# Patient Record
Sex: Male | Born: 1955 | Race: Black or African American | Hispanic: No | Marital: Single | Smoking: Former smoker
Health system: Southern US, Community
[De-identification: ages and names within clinical notes are randomized; demographics above are authoritative.]

## PROBLEM LIST (undated history)

## (undated) DIAGNOSIS — F439 Reaction to severe stress, unspecified: Secondary | ICD-10-CM

## (undated) DIAGNOSIS — N390 Urinary tract infection, site not specified: Secondary | ICD-10-CM

## (undated) DIAGNOSIS — M109 Gout, unspecified: Secondary | ICD-10-CM

---

## 2005-03-14 ENCOUNTER — Emergency Department (HOSPITAL_COMMUNITY): Admission: EM | Admit: 2005-03-14 | Discharge: 2005-03-14 | Payer: Self-pay | Admitting: *Deleted

## 2019-09-21 ENCOUNTER — Other Ambulatory Visit: Payer: Self-pay | Admitting: *Deleted

## 2019-09-21 DIAGNOSIS — Z20822 Contact with and (suspected) exposure to covid-19: Secondary | ICD-10-CM

## 2019-09-23 LAB — NOVEL CORONAVIRUS, NAA: SARS-CoV-2, NAA: NOT DETECTED

## 2019-09-24 ENCOUNTER — Telehealth: Payer: Self-pay | Admitting: General Practice

## 2019-09-24 NOTE — Telephone Encounter (Signed)
Gave patient negative covid test results Patient understood 

## 2019-10-10 ENCOUNTER — Ambulatory Visit
Admission: EM | Admit: 2019-10-10 | Discharge: 2019-10-10 | Disposition: A | Discharge status: Home / Self Care | Payer: No Typology Code available for payment source | Attending: Emergency Medicine | Admitting: Emergency Medicine

## 2019-10-10 ENCOUNTER — Other Ambulatory Visit: Payer: Self-pay

## 2019-10-10 DIAGNOSIS — N3001 Acute cystitis with hematuria: Secondary | ICD-10-CM | POA: Diagnosis present

## 2019-10-10 HISTORY — DX: Gout, unspecified: M10.9

## 2019-10-10 LAB — POCT URINALYSIS DIP (MANUAL ENTRY)
Bilirubin, UA: NEGATIVE
Glucose, UA: 100 mg/dL — AB
Ketones, POC UA: NEGATIVE mg/dL
Nitrite, UA: POSITIVE — AB
Protein Ur, POC: >=300 mg/dL — AB
Spec Grav, UA: 1.01 (ref 1.010–1.025)
Urobilinogen, UA: 2 E.U./dL — AB
pH, UA: 5 (ref 5.0–8.0)

## 2019-10-10 MED ORDER — SULFAMETHOXAZOLE-TRIMETHOPRIM 800-160 MG PO TABS: 1.0000 | ORAL_TABLET | Freq: Two times a day (BID) | ORAL | 0 refills | Status: AC

## 2019-10-10 MED ORDER — PHENAZOPYRIDINE HCL 100 MG PO TABS: 100.0000 mg | ORAL_TABLET | Freq: Three times a day (TID) | ORAL | 0 refills | Status: AC | PRN

## 2019-10-10 NOTE — ED Provider Notes (Addendum)
[facility]    CSN: [id number] Arrival date & time: 10/10/19  1042      History   Chief Complaint Chief Complaint  Patient presents with  . Dysuria    HPI Nicholas Sims is a 63 y.o. male.   Patient also complains of dysuria, urgency and difficulty urinating that began 3 days ago.  He denies a precipitating event or recent sexual encounter. He has tried OTC Azo without relief.  His symptoms are made worse with urination.  He reports similar symptoms in the past that improved with treatment. He complains of decreased amount, increased urgency.  He denies fever, chills, nausea, vomiting, abdominal pain, flank pain, or incontinence  The history is provided by the patient. No language interpreter was used.  Dysuria Presenting symptoms: dysuria     Past Medical History:  Diagnosis Date  . Gout     There are no problems to display for this patient.   History reviewed. No pertinent surgical history.     Home Medications    Prior to Admission medications   Medication Sig Start Date End Date Taking? Authorizing Provider  budesonide-formoterol (SYMBICORT) 160-4.5 MCG/ACT inhaler Inhale 2 puffs into the lungs 2 (two) times daily.   Yes [provider]  furosemide (LASIX) 20 MG tablet Take 20 mg by mouth as needed.   Yes [provider]  phenazopyridine (PYRIDIUM) 100 MG tablet Take 1 tablet (100 mg total) by mouth 3 (three) times daily as needed for pain. 10/10/19   Micca Matura, Zachery DakinsKomlanvi S, FNP  sulfamethoxazole-trimethoprim (BACTRIM DS) 800-160 MG tablet Take 1 tablet by mouth 2 (two) times daily for 7 days. 10/10/19 10/17/19  Durward ParcelAvegno, Malissia Rabbani S, FNP    Family History Family History  Problem Relation Age of Onset  . Diabetes Mother   . COPD Father     Social History Social History   Tobacco Use  . Smoking status: Never Smoker  . Smokeless tobacco: Never Used  Substance Use Topics  . Alcohol use: Not Currently  . Drug use: Not  Currently     Allergies   Penicillins   Review of Systems Review of Systems  Constitutional: Negative.   Respiratory: Negative.   Cardiovascular: Negative.   Gastrointestinal: Negative.   Genitourinary: Positive for dysuria and urgency.  ROS: All other are negative  Physical Exam Triage Vital Signs ED Triage Vitals  Enc Vitals Group     BP 10/10/19 1056 127/77     Pulse Rate 10/10/19 1056 63     Resp 10/10/19 1056 18     Temp 10/10/19 1056 98.1 F (36.7 C)     Temp src --      SpO2 10/10/19 1056 94 %     Weight --      Height --      Head Circumference --      Peak Flow --      Pain Score 10/10/19 1052 7     Pain Loc --      Pain Edu? --      Excl. in GC? --    No data found.  Updated Vital Signs BP 127/77   Pulse 63   Temp 98.1 F (36.7 C)   Resp 18   SpO2 94%   Visual Acuity Right Eye Distance:   Left Eye Distance:   Bilateral Distance:    Right Eye Near:   Left Eye Near:    Bilateral Near:     Physical Exam Constitutional:  General: He is not in acute distress.    Appearance: Normal appearance. He is not ill-appearing or toxic-appearing.  Cardiovascular:     Rate and Rhythm: Normal rate and regular rhythm.     Pulses: Normal pulses.     Heart sounds: Normal heart sounds. No murmur.  Pulmonary:     Effort: Pulmonary effort is normal. No respiratory distress.     Breath sounds: Normal breath sounds.  Chest:     Chest wall: No tenderness.  Abdominal:     General: Abdomen is flat. There is no distension.     Palpations: Abdomen is soft. There is no mass.     Tenderness: There is no abdominal tenderness. There is no guarding or rebound.     Hernia: No hernia is present.  Genitourinary:    Penis: Normal.      Testes: Normal.  Neurological:     Mental Status: He is alert.      UC Treatments / Results  Labs (all labs ordered are listed, but only abnormal results are displayed) Labs Reviewed  POCT URINALYSIS DIP (MANUAL ENTRY) -  Abnormal; Notable for the following components:      Result Value   Color, UA orange (*)    Clarity, UA cloudy (*)    Glucose, UA =100 (*)    Blood, UA large (*)    Protein Ur, POC >=300 (*)    Urobilinogen, UA 2.0 (*)    Nitrite, UA Positive (*)    Leukocytes, UA Large (3+) (*)    All other components within normal limits  URINE CULTURE    EKG   Radiology No results found.  Procedures Procedures (including critical care time)  Medications Ordered in UC Medications - No data to display  Initial Impression / Assessment and Plan / UC Course  I have reviewed the triage vital signs and the nursing notes.  Pertinent labs & imaging results that were available during my care of the patient were reviewed by me and considered in my medical decision making (see chart for details).   Patient stable for discharge.  Benign physical exam.  UA showed large blood red blood cell positive nitrate and white blood cell.  Bactrim DS was prescribed.  Urine was sent for culture.  Will call patient with result.  Advised patient to follow-up with the [place].  Patient verbalized understanding of the plan of care. Final Clinical Impressions(s) / UC Diagnoses   Final diagnoses:  Acute cystitis with hematuria     Discharge Instructions     UA show large red blood cell, positive nitrite and white blood cell Urine culture sent.  We will call you with the results.   Push fluids and get plenty of rest.   Take antibiotic as directed and to completion Take pyridium as prescribed and as needed for symptomatic relief Follow up with PCP if symptoms persists Return here or go to ER if you have any new or worsening symptoms such as fever, worsening abdominal pain, nausea/vomiting, flank pain     ED Prescriptions    Medication Sig Dispense Auth. Provider   phenazopyridine (PYRIDIUM) 100 MG tablet Take 1 tablet (100 mg total) by mouth 3 (three) times daily as needed for pain. 10 tablet Markan Cazarez, Darrelyn Hillock, FNP    sulfamethoxazole-trimethoprim (BACTRIM DS) 800-160 MG tablet Take 1 tablet by mouth 2 (two) times daily for 7 days. 14 tablet Cydney Alvarenga, Darrelyn Hillock, FNP     PDMP not reviewed this encounter.   Gabrelle Roca,  Zachery Dakins, FNP 10/10/19 1132    Durward Parcel, FNP 10/10/19 1427

## 2019-10-10 NOTE — ED Triage Notes (Signed)
Pt presents with difficulty urinating, pt  States that  He has the urge but difficulty urinating, has noticed some blood in urine over past 3 days

## 2019-10-10 NOTE — Discharge Instructions (Addendum)
UA show large red blood cell, positive nitrite and white blood cell Urine culture sent.  We will call you with the results.   Push fluids and get plenty of rest.   Take antibiotic as directed and to completion Take pyridium as prescribed and as needed for symptomatic relief Follow up with PCP if symptoms persists Return here or go to ER if you have any new or worsening symptoms such as fever, worsening abdominal pain, nausea/vomiting, flank pain

## 2019-10-11 ENCOUNTER — Ambulatory Visit: Payer: No Typology Code available for payment source | Attending: Internal Medicine

## 2019-10-11 DIAGNOSIS — Z20822 Contact with and (suspected) exposure to covid-19: Secondary | ICD-10-CM

## 2019-10-12 LAB — URINE CULTURE: Culture: >=100000 — AB

## 2019-10-12 LAB — NOVEL CORONAVIRUS, NAA: SARS-CoV-2, NAA: NOT DETECTED

## 2020-01-30 ENCOUNTER — Emergency Department (HOSPITAL_COMMUNITY): Payer: No Typology Code available for payment source

## 2020-01-30 ENCOUNTER — Other Ambulatory Visit: Payer: Self-pay

## 2020-01-30 ENCOUNTER — Encounter (HOSPITAL_COMMUNITY): Payer: Self-pay | Admitting: Emergency Medicine

## 2020-01-30 ENCOUNTER — Emergency Department (HOSPITAL_COMMUNITY)
Admission: EM | Admit: 2020-01-30 | Discharge: 2020-01-30 | Disposition: A | Discharge status: Home / Self Care | Payer: No Typology Code available for payment source | Attending: Emergency Medicine | Admitting: Emergency Medicine

## 2020-01-30 DIAGNOSIS — Z79899 Other long term (current) drug therapy: Secondary | ICD-10-CM | POA: Insufficient documentation

## 2020-01-30 DIAGNOSIS — S99922A Unspecified injury of left foot, initial encounter: Secondary | ICD-10-CM | POA: Diagnosis present

## 2020-01-30 DIAGNOSIS — Y999 Unspecified external cause status: Secondary | ICD-10-CM | POA: Diagnosis not present

## 2020-01-30 DIAGNOSIS — Y9389 Activity, other specified: Secondary | ICD-10-CM | POA: Diagnosis not present

## 2020-01-30 DIAGNOSIS — Z23 Encounter for immunization: Secondary | ICD-10-CM | POA: Insufficient documentation

## 2020-01-30 DIAGNOSIS — W319XXA Contact with unspecified machinery, initial encounter: Secondary | ICD-10-CM | POA: Diagnosis not present

## 2020-01-30 DIAGNOSIS — Z87891 Personal history of nicotine dependence: Secondary | ICD-10-CM | POA: Diagnosis not present

## 2020-01-30 DIAGNOSIS — Y929 Unspecified place or not applicable: Secondary | ICD-10-CM | POA: Insufficient documentation

## 2020-01-30 DIAGNOSIS — S93115A Dislocation of interphalangeal joint of left lesser toe(s), initial encounter: Secondary | ICD-10-CM | POA: Diagnosis not present

## 2020-01-30 DIAGNOSIS — S93119A Dislocation of interphalangeal joint of unspecified toe(s), initial encounter: Secondary | ICD-10-CM

## 2020-01-30 MED ORDER — TETANUS-DIPHTH-ACELL PERTUSSIS 5-2.5-18.5 LF-MCG/0.5 IM SUSP
0.5000 mL | Freq: Once | INTRAMUSCULAR | Status: AC
  Administered 2020-01-30: 0.5 mL via INTRAMUSCULAR
  Filled 2020-01-30: qty 0.5

## 2020-01-30 MED ORDER — OXYCODONE-ACETAMINOPHEN 5-325 MG PO TABS
1.0000 | ORAL_TABLET | Freq: Once | ORAL | Status: AC
  Administered 2020-01-30: 20:00:00 1 via ORAL
  Filled 2020-01-30: qty 1

## 2020-01-30 NOTE — Discharge Instructions (Addendum)
Clean the wounds with mild soap and water.  You may keep the bandage.  You will need to keep the left third toe buddy taped for at least 2 to 3 weeks.  Wear the postop shoe as needed for support when weightbearing.  You may remove it at bedtime.  Follow-up with your orthopedic provider at the [facility] or you may contact Dr. Mort Sawyers office to arrange a follow-up appointment.  Tylenol every 4 hours if needed for pain.

## 2020-01-30 NOTE — ED Triage Notes (Signed)
Rolled a generator onto L foot   Believes left middle toe is broken  Abrasion to L second toe

## 2020-02-01 NOTE — ED Provider Notes (Signed)
[facility] EMERGENCY DEPARTMENT Provider Note   CSN: [id number] Arrival date & time: 01/30/20  1628     History No chief complaint on file.   Nicholas Sims is a 64 y.o. male.  HPI      Nicholas Sims is a 64 y.o. male who presents to the Emergency Department complaining of pain and deformity of the left third toe secondary to a direct blow that occurred shortly before ER arrival.  He states that he accidentally rolled a heavy generator onto his left foot.  He also reports abrasion to his left third and fourth toes.  He complains of throbbing pain to the toe and is concerned his toe may be broken.  He denies numbness of his foot or toes, ankle pain and use of blood thinners.  Last tetanus is unknown.   Past Medical History:  Diagnosis Date  . Gout     There are no problems to display for this patient.   History reviewed. No pertinent surgical history.     Family History  Problem Relation Age of Onset  . Diabetes Mother   . COPD Father     Social History   Tobacco Use  . Smoking status: Former Research scientist (life sciences)  . Smokeless tobacco: Never Used  Substance Use Topics  . Alcohol use: Not Currently  . Drug use: Not Currently    Home Medications Prior to Admission medications   Medication Sig Start Date End Date Taking? Authorizing Provider  budesonide-formoterol (SYMBICORT) 160-4.5 MCG/ACT inhaler Inhale 2 puffs into the lungs 2 (two) times daily.    [provider]  furosemide (LASIX) 20 MG tablet Take 20 mg by mouth as needed.    [provider]  phenazopyridine (PYRIDIUM) 100 MG tablet Take 1 tablet (100 mg total) by mouth 3 (three) times daily as needed for pain. 10/10/19   Emerson Monte, FNP    Allergies    Penicillins  Review of Systems   Review of Systems  Constitutional: Negative for chills and fever.  Musculoskeletal: Positive for arthralgias (Left third toe pain and swelling) and joint swelling.  Skin: Negative for color change  and wound (Abrasion of the left third and fourth toes).  Neurological: Negative for weakness and numbness.  All other systems reviewed and are negative.   Physical Exam Updated Vital Signs BP (!) 174/94 (BP Location: Left Arm)   Pulse 77   Temp 98.4 F (36.9 C) (Oral)   Resp 18   Ht 6\' 2"  (1.88 m)   Wt 117.9 kg   SpO2 96%   BMI 33.38 kg/m   Physical Exam Vitals and nursing note reviewed.  Constitutional:      Appearance: Normal appearance.  HENT:     Head: Atraumatic.  Cardiovascular:     Rate and Rhythm: Normal rate and regular rhythm.     Pulses: Normal pulses.  Pulmonary:     Effort: Pulmonary effort is normal.  Musculoskeletal:        General: Tenderness, deformity and signs of injury present.     Comments: Tenderness with bony deformity of the left third toe.  There is a small abrasion present to the dorsal surface of the third and fourth toes.  No edema.  Bleeding controlled.  No subungual hematomas and the nail of the third and fourth toe appears intact.  Skin:    General: Skin is warm.     Capillary Refill: Capillary refill takes less than 2 seconds.  Neurological:  General: No focal deficit present.     Sensory: No sensory deficit.     Motor: No weakness.     ED Results / Procedures / Treatments   Labs (all labs ordered are listed, but only abnormal results are displayed) Labs Reviewed - No data to display  EKG None  Radiology DG Toe 3rd Left  Result Date: 01/30/2020 CLINICAL DATA:  Post reduction EXAM: LEFT THIRD TOE COMPARISON:  01/30/2020 at 1723 hours FINDINGS: Interval reduction of the 3rd digit at the PIP joint. No fracture is seen. The visualized soft tissues are unremarkable. IMPRESSION: Interval reduction of the 3rd digit at the PIP joint. Electronically Signed   By: Charline Bills M.D.   On: 01/30/2020 21:59   DG Toe 3rd Left  Result Date: 01/30/2020 CLINICAL DATA:  Left third toe injury EXAM: LEFT THIRD TOE COMPARISON:  None.  FINDINGS: Plantar dislocation of the third digit proximal interphalangeal joint. No acute fracture is seen. Normal alignment of the third MTP and DIP joints. Soft tissue swelling. No radiopaque foreign body. IMPRESSION: Dislocation of the third digit PIP joint. No fracture identified. Attention on postreduction imaging. Electronically Signed   By: Duanne Guess D.O.   On: 01/30/2020 17:54    Procedures .Ortho Injury Treatment  Date/Time: 01/30/2020 9:30 AM Performed by: Pauline Aus, PA-C Authorized by: Pauline Aus, PA-C   Consent:    Consent obtained:  Verbal   Consent given by:  Patient   Risks discussed:  Fracture and nerve damageInjury location: toe Location details: left third toe Injury type: dislocation Dislocation type: PIP Pre-procedure neurovascular assessment: neurovascularly intact Pre-procedure distal perfusion: normal Pre-procedure neurological function: normal Pre-procedure range of motion: normal  Anesthesia: Local anesthesia used: no  Patient sedated: NoManipulation performed: yes Reduction successful: yes X-ray confirmed reduction: yes Immobilization: tape Splint type: buddy taped toes. Supplies used: cotton padding Post-procedure neurovascular assessment: post-procedure neurovascularly intact Post-procedure distal perfusion: normal Post-procedure neurological function: normal Post-procedure range of motion: normal Patient tolerance: patient tolerated the procedure well with no immediate complications    (including critical care time)    Medications Ordered in ED Medications  Tdap (BOOSTRIX) injection 0.5 mL (0.5 mLs Intramuscular Given 01/30/20 1931)  oxyCODONE-acetaminophen (PERCOCET/ROXICET) 5-325 MG per tablet 1 tablet (1 tablet Oral Given 01/30/20 1931)    ED Course  I have reviewed the triage vital signs and the nursing notes.  Pertinent labs & imaging results that were available during my care of the patient were reviewed by me and  considered in my medical decision making (see chart for details).    MDM Rules/Calculators/A&P                      Patient with mechanical injury of the left third toe resulting in a dislocation at the PIP joint.  No open fracture.  Successful reduction in wound, confirmed with postreduction imaging.  Digit remains neurovascularly intact.  No nail injury or subungual hematoma.  Abrasion of the toes were cleaned and bandaged.  Toe was buddy taped and postop shoe applied.  Patient agrees to orthopedic follow-up  Final Clinical Impression(s) / ED Diagnoses Final diagnoses:  Dislocation of interphalangeal joint of toe, initial encounter    Rx / DC Orders ED Discharge Orders    None       Pauline Aus, PA-C 02/01/20 1346    Long, Arlyss Repress, MD 02/02/20 1352

## 2020-10-25 ENCOUNTER — Ambulatory Visit (INDEPENDENT_AMBULATORY_CARE_PROVIDER_SITE_OTHER): Payer: No Typology Code available for payment source | Admitting: Allergy & Immunology

## 2020-10-25 ENCOUNTER — Encounter: Payer: Self-pay | Admitting: Allergy & Immunology

## 2020-10-25 ENCOUNTER — Other Ambulatory Visit: Payer: Self-pay

## 2020-10-25 VITALS — BP 138/88 | HR 70 | Temp 98.1°F | Resp 18 | Ht 74.02 in | Wt 259.0 lb

## 2020-10-25 DIAGNOSIS — J3089 Other allergic rhinitis: Secondary | ICD-10-CM | POA: Diagnosis not present

## 2020-10-25 DIAGNOSIS — J302 Other seasonal allergic rhinitis: Secondary | ICD-10-CM | POA: Diagnosis not present

## 2020-10-25 DIAGNOSIS — L853 Xerosis cutis: Secondary | ICD-10-CM | POA: Insufficient documentation

## 2020-10-25 DIAGNOSIS — J449 Chronic obstructive pulmonary disease, unspecified: Secondary | ICD-10-CM | POA: Diagnosis not present

## 2020-10-25 DIAGNOSIS — J4489 Other specified chronic obstructive pulmonary disease: Secondary | ICD-10-CM

## 2020-10-25 HISTORY — DX: Chronic obstructive pulmonary disease, unspecified: J44.9

## 2020-10-25 HISTORY — DX: Other specified chronic obstructive pulmonary disease: J44.89

## 2020-10-25 MED ORDER — FLUTICASONE PROPIONATE 50 MCG/ACT NA SUSP: 2.0000 | Freq: Every day | NASAL | 5 refills | Status: DC

## 2020-10-25 MED ORDER — CETIRIZINE HCL 10 MG PO TABS: 10.0000 mg | ORAL_TABLET | Freq: Every day | ORAL | 5 refills | Status: DC

## 2020-10-25 NOTE — Patient Instructions (Addendum)
1. Asthma-COPD overlap syndrome (HCC) - Lung testing looked to be in the 50% range today and there was minimal improvement with the albuterol inhaler. - I think that you need to take your Wixela EVERY day.  - Daily controller medication(s): Wixela 250/48mcg one puff twice daily EVERY DAY - Prior to physical activity: albuterol 2 puffs 10-15 minutes before physical activity. - Rescue medications: albuterol 4 puffs every 4-6 hours as needed - Asthma control goals:  * Full participation in all desired activities (may need albuterol before activity) * Albuterol use two time or less a week on average (not counting use with activity) * Cough interfering with sleep two time or less a month * Oral steroids no more than once a year * No hospitalizations  2. Chronic rhinitis - Testing today showed: grasses, ragweed, weeds, trees, indoor molds, outdoor molds, dust mites, cat, dog and cockroach - Copy of test results provided.  - Avoidance measures provided. - Start taking: Zyrtec (cetirizine) 10mg  tablet once daily and Flonase (fluticasone) two sprays per nostril daily- You can use an extra dose of the antihistamine, if needed, for breakthrough symptoms.  - Consider nasal saline rinses 1-2 times daily to remove allergens from the nasal cavities as well as help with mucous clearance (this is especially helpful to do before the nasal sprays are given) - Consider allergy shots as a means of long-term control. - Allergy shots "re-train" and "reset" the immune system to ignore environmental allergens and decrease the resulting immune response to those allergens (sneezing, itchy watery eyes, runny nose, nasal congestion, etc).    - Allergy shots improve symptoms in 75-85% of patients.  - We can discuss more at the next appointment if the medications are not working for you.  3. Dry skin - Be sure to moisturize regularly. - The addition of the antihistamine will help with the dry skin as well. - We can send  in a topical steroid if needed at the next visit.  4. Return in about 6 weeks (around 12/06/2020).  Please inform 12/08/2020 of any Emergency Department visits, hospitalizations, or changes in symptoms. Call us before going to the ED for breathing or allergy symptoms since we might be able to fit you in for a sick visit. Feel free to contact us anytime with any questions, problems, or concerns.  It was a pleasure to meet you today!  Websites that have reliable patient information: 1. American Academy of Asthma, Allergy, and Immunology: www.aaaai.org 2. Food Allergy Research and Education (FARE): foodallergy.org 3. Mothers of Asthmatics: http://www.asthmacommunitynetwork.org 4. American College of Allergy, Asthma, and Immunology: www.acaai.org   COVID-19 Vaccine Information can be found at: [place] For questions related to vaccine distribution or appointments, please email [email or web address] or call [phone].     "Like" [phone] on Facebook and Instagram for our latest updates!       Make sure you are registered to vote! If you have moved or changed any of your contact information, you will need to get this updated before voting!  In some cases, you MAY be able to register to vote online: [place]    Reducing Pollen Exposure  The American Academy of Allergy, Asthma and Immunology suggests the following steps to reduce your exposure to pollen during allergy seasons.    1. Do not hang sheets or clothing out to dry; pollen may collect on these items. 2. Do not mow lawns or spend time around freshly cut grass; mowing stirs up pollen. 3. Keep windows closed at night.  Keep car windows closed while driving. 4. Minimize morning activities outdoors, a time when pollen counts are usually at their highest. 5. Stay indoors as much as possible when pollen counts or humidity is high and on  windy days when pollen tends to remain in the air longer. 6. Use air conditioning when possible.  Many air conditioners have filters that trap the pollen spores. 7. Use a HEPA room air filter to remove pollen form the indoor air you breathe.  Control of Mold Allergen   Mold and fungi can grow on a variety of surfaces provided certain temperature and moisture conditions exist.  Outdoor molds grow on plants, decaying vegetation and soil.  The major outdoor mold, Alternaria and Cladosporium, are found in very high numbers during hot and dry conditions.  Generally, a late Summer - Fall peak is seen for common outdoor fungal spores.  Rain will temporarily lower outdoor mold spore count, but counts rise rapidly when the rainy period ends.  The most important indoor molds are Aspergillus and Penicillium.  Dark, humid and poorly ventilated basements are ideal sites for mold growth.  The next most common sites of mold growth are the bathroom and the kitchen.  Outdoor (Seasonal) Mold Control  Positive outdoor molds via skin testing: Bipolaris (Helminthsporium), Drechslera (Curvalaria) and Mucor  1. Use air conditioning and keep windows closed 2. Avoid exposure to decaying vegetation. 3. Avoid leaf raking. 4. Avoid grain handling. 5. Consider wearing a face mask if working in moldy areas.  6.   Indoor (Perennial) Mold Control   Positive indoor molds via skin testing: Aspergillus, Penicillium, Fusarium, Aureobasidium (Pullulara) and Rhizopus  1. Maintain humidity below 50%. 2. Clean washable surfaces with 5% bleach solution. 3. Remove sources e.g. contaminated carpets.     Control of Dust Mite Allergen    Dust mites play a major role in allergic asthma and rhinitis.  They occur in environments with high humidity wherever human skin is found.  Dust mites absorb humidity from the atmosphere (ie, they do not drink) and feed on organic matter (including shed human and animal skin).  Dust mites are  a microscopic type of insect that you cannot see with the naked eye.  High levels of dust mites have been detected from mattresses, pillows, carpets, upholstered furniture, bed covers, clothes, soft toys and any woven material.  The principal allergen of the dust mite is found in its feces.  A gram of dust may contain 1,000 mites and 250,000 fecal particles.  Mite antigen is easily measured in the air during house cleaning activities.  Dust mites do not bite and do not cause harm to humans, other than by triggering allergies/asthma.    Ways to decrease your exposure to dust mites in your home:  1. Encase mattresses, box springs and pillows with a mite-impermeable barrier or cover   2. Wash sheets, blankets and drapes weekly in hot water (130 F) with detergent and dry them in a dryer on the hot setting.  3. Have the room cleaned frequently with a vacuum cleaner and a damp dust-mop.  For carpeting or rugs, vacuuming with a vacuum cleaner equipped with a high-efficiency particulate air (HEPA) filter.  The dust mite allergic individual should not be in a room which is being cleaned and should wait 1 hour after cleaning before going into the room. 4. Do not sleep on upholstered furniture (eg, couches).   5. If possible removing carpeting, upholstered furniture and drapery from the home is ideal.  Horizontal blinds should be eliminated in the rooms where the person spends the most time (bedroom, study, television room).  Washable vinyl, roller-type shades are optimal. 6. Remove all non-washable stuffed toys from the bedroom.  Wash stuffed toys weekly like sheets and blankets above.   7. Reduce indoor humidity to less than 50%.  Inexpensive humidity monitors can be purchased at most hardware stores.  Do not use a humidifier as can make the problem worse and are not recommended.  Control of Dog or Cat Allergen  Avoidance is the best way to manage a dog or cat allergy. If you have a dog or cat and are allergic  to dog or cats, consider removing the dog or cat from the home. If you have a dog or cat but don't want to find it a new home, or if your family wants a pet even though someone in the household is allergic, here are some strategies that may help keep symptoms at bay:  1. Keep the pet out of your bedroom and restrict it to only a few rooms. Be advised that keeping the dog or cat in only one room will not limit the allergens to that room. 2. Don't pet, hug or kiss the dog or cat; if you do, wash your hands with soap and water. 3. High-efficiency particulate air (HEPA) cleaners run continuously in a bedroom or living room can reduce allergen levels over time. 4. Regular use of a high-efficiency vacuum cleaner or a central vacuum can reduce allergen levels. 5. Giving your dog or cat a bath at least once a week can reduce airborne allergen.  Control of Cockroach Allergen  Cockroach allergen has been identified as an important cause of acute attacks of asthma, especially in urban settings.  There are fifty-five species of cockroach that exist in the [place], however only three, the [place], [place] species produce allergen that can affect patients with Asthma.  Allergens can be obtained from fecal particles, egg casings and secretions from cockroaches.    1. Remove food sources. 2. Reduce access to water. 3. Seal access and entry points. 4. Spray runways with 0.5-1% Diazinon or Chlorpyrifos 5. Blow boric acid power under stoves and refrigerator. 6. Place bait stations (hydramethylnon) at feeding sites.

## 2020-10-25 NOTE — Progress Notes (Signed)
NEW PATIENT  Date of Service/Encounter:  10/25/20  Referring provider: Clinic, [facility]   Assessment:   Asthma-COPD overlap syndrome  Seasonal and perennial allergic rhinitis (grasses, ragweed, weeds, trees, indoor molds, outdoor molds, dust mites, cat, dog and cockroach)  Dry skin  Plan/Recommendations:   1. Asthma-COPD overlap syndrome (HCC) - Lung testing looked to be in the 50% range today and there was minimal improvement with the albuterol inhaler. - I think that you need to take your Wixela EVERY day.  - Daily controller medication(s): Wixela 250/63mcg one puff twice daily EVERY DAY - Prior to physical activity: albuterol 2 puffs 10-15 minutes before physical activity. - Rescue medications: albuterol 4 puffs every 4-6 hours as needed - Asthma control goals:  * Full participation in all desired activities (may need albuterol before activity) * Albuterol use two time or less a week on average (not counting use with activity) * Cough interfering with sleep two time or less a month * Oral steroids no more than once a year * No hospitalizations  2. Chronic rhinitis - Testing today showed: grasses, ragweed, weeds, trees, indoor molds, outdoor molds, dust mites, cat, dog and cockroach - Copy of test results provided.  - Avoidance measures provided. - Start taking: Zyrtec (cetirizine) 10mg  tablet once daily and Flonase (fluticasone) two sprays per nostril daily- You can use an extra dose of the antihistamine, if needed, for breakthrough symptoms.  - Consider nasal saline rinses 1-2 times daily to remove allergens from the nasal cavities as well as help with mucous clearance (this is especially helpful to do before the nasal sprays are given) - Consider allergy shots as a means of long-term control. - Allergy shots "re-train" and "reset" the immune system to ignore environmental allergens and decrease the resulting immune response to those allergens (sneezing, itchy  watery eyes, runny nose, nasal congestion, etc).    - Allergy shots improve symptoms in 75-85% of patients.  - We can discuss more at the next appointment if the medications are not working for you.  3. Dry skin - Be sure to moisturize regularly. - The addition of the antihistamine will help with the dry skin as well. - We can send in a topical steroid if needed at the next visit.  4. Return in about 6 weeks (around 12/06/2020).  Subjective:   Nicholas Sims is a 65 y.o. male presenting today for evaluation of  Chief Complaint  Patient presents with  . Sinus Problem  . Allergy Testing    Nicholas Sims has a history of the following: Patient Active Problem List   Diagnosis Date Noted  . Asthma-COPD overlap syndrome (HCC) 10/25/2020  . Seasonal and perennial allergic rhinitis 10/25/2020  . Dry skin 10/25/2020    History obtained from: chart review and patient.  Nicholas Sims was referred by Clinic, [facility].     Nicholas Sims is a 65 y.o. male presenting for an evaluation of environmental allergies.  He was recently diagnosed with possible allergies. He reports sneezing and coughing in the mornings. Symptoms are bad when the trees leaf out. When the weather changes, it seems that the symptoms get worse. He has never been tested before. He does not take anything for these symptoms at all. He would use a nasal spray around 40 years ago. He does not remember the name of it.  He has done a lot of drugs in the distant past. 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 He was  based in [place]. There was a lot of pollution there which bothered him. He does not have asthma that he knows about. He did smoke until 4-5 years ago, around 2 packs per day. He was on Symbicort at one point PRN, but this was changed to Advair. He also has albuterol that he uses very rarely, probably a couple of months. He has not needed the Advair since November 2021. He has lost weight as well. His breathing  is managed by his PCP in [place] at the [place] there.   He denies any steroid use or antibiotics for his breathing. He does not get sinus infections often. He has had two in his lifetime.   Once he was discharged from [employer], he did some odd jobs. He did work in [employer] nearby. He did that for the longest period of time around 5-6 years. 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 He is now semi retired. He does PRN work as needed and he works there for the last six years. He went back to school to get trained for that.   He has a history of elevated BP as well as a chronic aortic dissection, which is followed by vascular surgery. He also has a history of OSA and has a CPAP machine which is not regularly used.   Otherwise, there is no history of other atopic diseases, including food allergies, drug allergies, stinging insect allergies, eczema, urticaria or contact dermatitis. There is no significant infectious history. Vaccinations are up to date.    Past Medical History: Patient Active Problem List   Diagnosis Date Noted  . Asthma-COPD overlap syndrome (HCC) 10/25/2020  . Seasonal and perennial allergic rhinitis 10/25/2020  . Dry skin 10/25/2020    Medication List:  Allergies as of 10/25/2020      Reactions   Penicillins       Medication List       Accurate as of October 25, 2020 12:16 PM. If you have any questions, ask your nurse or doctor.        STOP taking these medications   budesonide-formoterol 160-4.5 MCG/ACT inhaler Commonly known as: SYMBICORT Stopped by: Alfonse Spruce, MD     TAKE these medications   acetaminophen 500 MG tablet Commonly known as: TYLENOL Take 500 mg by mouth every 6 (six) hours as needed.   furosemide 20 MG tablet Commonly known as: LASIX Take 20 mg by mouth as needed.   losartan 50 MG tablet Commonly known as: COZAAR Take 50 mg by mouth daily.    phenazopyridine 100 MG tablet Commonly known as: Pyridium Take 1 tablet (100 mg total) by mouth 3 (three) times daily as needed for pain.   potassium chloride 10 MEQ CR capsule Commonly known as: MICRO-K Take 10 mEq by mouth 2 (two) times daily.   rosuvastatin 10 MG tablet Commonly known as: CRESTOR Take 10 mg by mouth daily.   tamsulosin 0.4 MG Caps capsule Commonly known as: FLOMAX Take 0.4 mg by mouth.   Wixela Inhub 250-50 MCG/DOSE Aepb Generic drug: Fluticasone-Salmeterol Inhale 1 puff into the lungs 2 (two) times daily.       Birth History: non-contributory  Developmental History: non-contributory  Past Surgical History: History reviewed. No pertinent surgical history.   Family History: Family History  Problem Relation Age of Onset  . Diabetes Mother   . COPD Father      Social History: Nicholas Sims lives at  home in a house that is 64 years old.  There is carpeting and hardwood throughout the home.  They have electric heating as well as oil heating with window units for cooling.  There is a dog outside of the home.  There are dust mite covers on the bedding.  There is no tobacco exposure currently, but he was a smoker for several years.  He quit around 5 years ago.  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 He also does some painting on the side house.     Review of Systems  Constitutional: Negative.  Negative for chills, fever, malaise/fatigue and weight loss.  HENT: Positive for congestion. Negative for ear discharge, ear pain, sinus pain and sore throat.   Eyes: Negative for pain, discharge and redness.  Respiratory: Positive for cough and shortness of breath. Negative for sputum production and wheezing.   Cardiovascular: Negative.  Negative for chest pain and palpitations.  Gastrointestinal: Negative for abdominal pain, constipation, diarrhea, heartburn, nausea and vomiting.   Skin: Negative.  Negative for itching and rash.  Neurological: Negative for dizziness and headaches.  Endo/Heme/Allergies: Negative for environmental allergies. Does not bruise/bleed easily.       Objective:   Blood pressure 138/88, pulse 70, temperature 98.1 F (36.7 C), temperature source Temporal, resp. rate 18, height 6' 2.02" (1.88 m), weight 259 lb (117.5 kg), SpO2 97 %. Body mass index is 33.24 kg/m.   Physical Exam:   Physical Exam Constitutional:      Appearance: He is well-developed.     Comments: Friendly and cooperative with the exam.   HENT:     Head: Normocephalic and atraumatic.     Right Ear: Tympanic membrane, ear canal and external ear normal. No drainage, swelling or tenderness. Tympanic membrane is not injected, scarred, erythematous, retracted or bulging.     Left Ear: Tympanic membrane, ear canal and external ear normal. No drainage, swelling or tenderness. Tympanic membrane is not injected, scarred, erythematous, retracted or bulging.     Nose: No nasal deformity, septal deviation, mucosal edema, rhinorrhea or epistaxis.     Right Turbinates: Enlarged and swollen.     Left Turbinates: Enlarged and swollen.     Right Sinus: No maxillary sinus tenderness or frontal sinus tenderness.     Left Sinus: No maxillary sinus tenderness or frontal sinus tenderness.     Mouth/Throat:     Mouth: Oropharynx is clear and moist. Mucous membranes are not pale and not dry.     Pharynx: Uvula midline.  Eyes:     General:        Right eye: No discharge.        Left eye: No discharge.     Extraocular Movements: EOM normal.     Conjunctiva/sclera: Conjunctivae normal.     Right eye: Right conjunctiva is not injected. No chemosis.    Left eye: Left conjunctiva is not injected. No chemosis.    Pupils: Pupils are equal, round, and reactive to light.  Cardiovascular:     Rate and Rhythm: Normal rate and regular rhythm.     Heart sounds: Normal heart sounds.  Pulmonary:      Effort: Pulmonary effort is normal. No tachypnea, accessory muscle usage or respiratory distress.     Breath sounds: Normal breath sounds. No wheezing, rhonchi or rales.     Comments: Moving air well in all lung fields. He does  have slightly decreased air movement at the bases. No tachypnea.  Chest:     Chest wall: No tenderness.  Abdominal:     Tenderness: There is no abdominal tenderness. There is no guarding or rebound.  Lymphadenopathy:     Head:     Right side of head: No submandibular, tonsillar or occipital adenopathy.     Left side of head: No submandibular, tonsillar or occipital adenopathy.     Cervical: No cervical adenopathy.  Skin:    Coloration: Skin is not pale.     Findings: No abrasion, erythema, petechiae or rash. Rash is not papular, urticarial or vesicular.     Comments: No eczematous or urticarial lesions noted.   Neurological:     Mental Status: He is alert.  Psychiatric:        Mood and Affect: Mood and affect normal.        Behavior: Behavior is cooperative.      Diagnostic studies:    Spirometry: results abnormal (FEV1: 1.81/55%, FVC: 2.40/56%, FEV1/FVC: 75%).    Spirometry consistent with possible restrictive disease. Albuterol four puffs via MDI treatment given in clinic with improvement in FEV1 and FVC, but not significant per ATS criteria.  Allergy Studies:     Airborne Adult Perc - 10/25/20 1000    Time Antigen Placed 0930    Allergen Manufacturer Lavella Hammock    Location Back    Number of Test 59    1. Control-Buffer 50% Glycerol Negative    2. Control-Histamine 1 mg/ml 2+    3. Albumin saline Negative    4. [place] Negative    5. [place] Negative    6. Johnson Negative    7. [place] Blue Negative    8. Meadow Fescue Negative    9. Perennial Rye Negative    10. Sweet Vernal Negative    11. Timothy Negative    12. Cocklebur Negative    13. Burweed Marshelder Negative    14. Ragweed, short Negative    15. Ragweed, Giant Negative    16.  Plantain,  English Negative    17. Lamb's Quarters Negative    18. Sheep Sorrell Negative    19. Rough Pigweed Negative    20. Marsh Elder, Rough Negative    21. Mugwort, Common Negative    22. Ash mix Negative    23. Birch mix Negative    24. Beech American Negative    25. Box, Elder Negative    26. Cedar, red Negative    27. Cottonwood, [place] Negative    28. Elm mix Negative    29. Hickory Negative    30. Maple mix Negative    31. Oak, [place] mix Negative    32. Pecan Pollen Negative    33. Pine mix Negative    34. Sycamore Eastern Negative    35. [place], Black Pollen Negative    36. Alternaria alternata Negative    37. Cladosporium Herbarum Negative    38. Aspergillus mix Negative    39. Penicillium mix Negative    40. Bipolaris sorokiniana (Helminthosporium) Negative    41. Drechslera spicifera (Curvularia) Negative    42. Mucor plumbeus Negative    43. Fusarium moniliforme Negative    44. Aureobasidium pullulans (pullulara) Negative    45. Rhizopus oryzae Negative    46. Botrytis cinera Negative    47. Epicoccum nigrum Negative    48. Phoma betae Negative    49. Candida Albicans Negative    50. Trichophyton mentagrophytes Negative  51. Mite, D Farinae  5,000 AU/ml Negative    52. Mite, D Pteronyssinus  5,000 AU/ml Negative    53. Cat Hair 10,000 BAU/ml Negative    54.  Dog Epithelia Negative    55. Mixed Feathers Negative    56. Horse Epithelia Negative    57. Cockroach, German Negative    58. Mouse Negative    59. Tobacco Leaf Negative          Intradermal - 10/25/20 1000    Time Antigen Placed 1000    Allergen Manufacturer Greer    Location Arm    Number of Test 15    Control Negative    [place] 1+    Johnson 1+    7 Grass 1+    Ragweed mix 2+    Weed mix 2+    Tree mix 1+    Mold 1 Negative    Mold 2 3+    Mold 3 1+    Mold 4 3+    Cat 4+    Dog 4+    Cockroach 1+    Mite mix 1+           Allergy testing results were read and  interpreted by myself, documented by clinical staff.         Malachi Bonds, MD [facility]

## 2020-10-26 ENCOUNTER — Telehealth: Payer: Self-pay | Admitting: Allergy & Immunology

## 2020-10-26 NOTE — Telephone Encounter (Signed)
Kim from the [facility] called to confirm pt was seen on 1/5. Requesting appt notes be faxed to Fax [phone]

## 2020-10-26 NOTE — Telephone Encounter (Signed)
Patient was seen on 10-25-20. I am unsure if Dr. Ellouise Newer note is signed yet.

## 2020-12-08 ENCOUNTER — Ambulatory Visit: Payer: No Typology Code available for payment source | Admitting: Allergy & Immunology

## 2021-01-05 IMAGING — DX DG TOE 3RD 2+V*L*
3 series · 3 of 3 positions shown · non-contrast
Comparison: 01/30/2020 at 0735 hours

CLINICAL DATA: Post reduction

EXAM:
LEFT THIRD TOE

[toe ap]
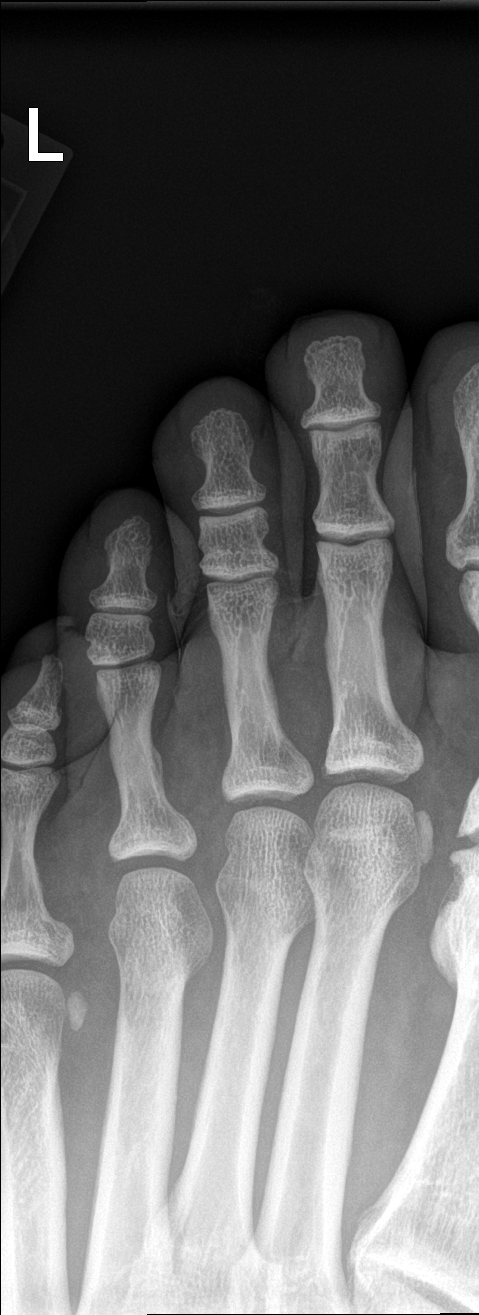

[toe obl]
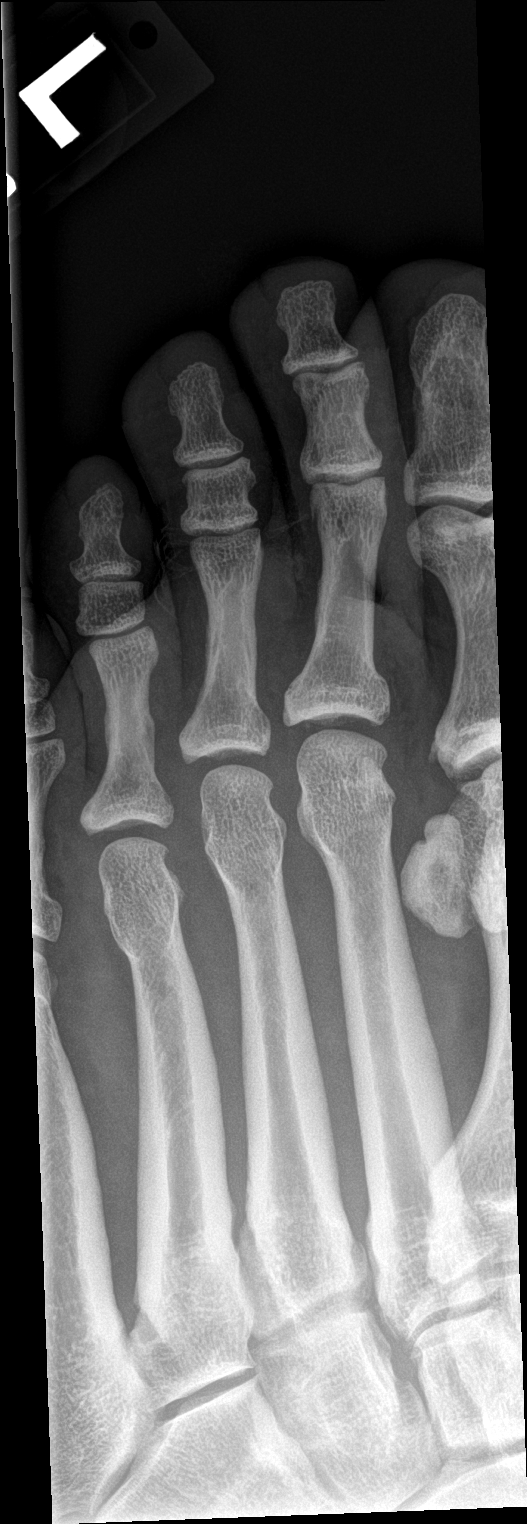

[toe lat]
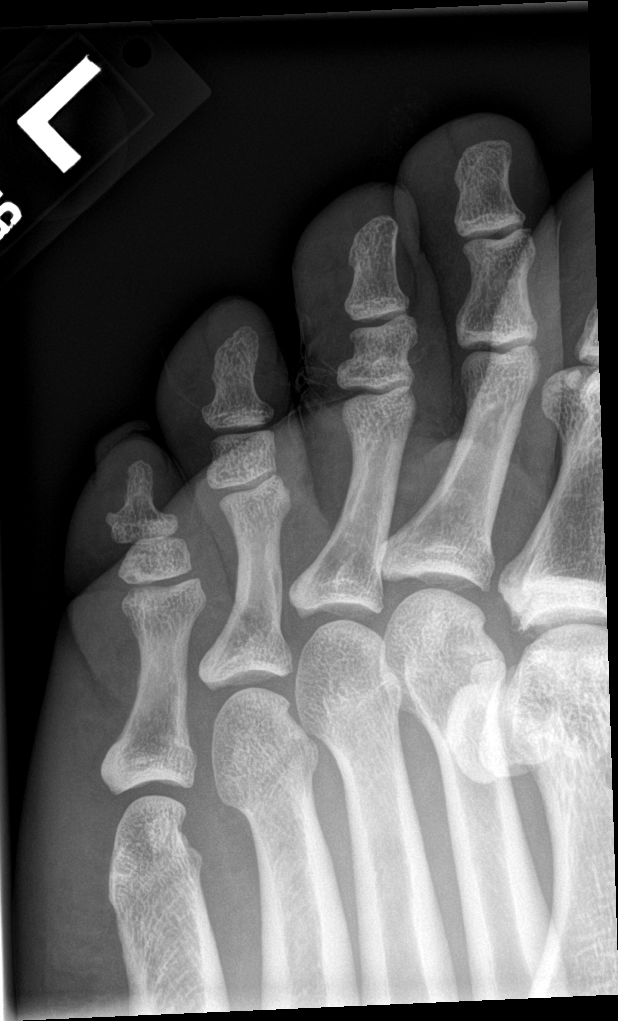

[3 of 3 positions shown; findings below may reference images not displayed]

FINDINGS: Interval reduction of the 3rd digit at the PIP joint.

No fracture is seen.

The visualized soft tissues are unremarkable.
IMPRESSION: Interval reduction of the 3rd digit at the PIP joint.

## 2021-01-17 ENCOUNTER — Ambulatory Visit (INDEPENDENT_AMBULATORY_CARE_PROVIDER_SITE_OTHER): Payer: No Typology Code available for payment source | Admitting: Allergy & Immunology

## 2021-01-17 ENCOUNTER — Encounter: Payer: Self-pay | Admitting: Allergy & Immunology

## 2021-01-17 ENCOUNTER — Other Ambulatory Visit: Payer: Self-pay

## 2021-01-17 VITALS — BP 150/84 | HR 57 | Temp 98.3°F | Resp 12 | Ht 74.0 in | Wt 262.2 lb

## 2021-01-17 DIAGNOSIS — J302 Other seasonal allergic rhinitis: Secondary | ICD-10-CM

## 2021-01-17 DIAGNOSIS — J449 Chronic obstructive pulmonary disease, unspecified: Secondary | ICD-10-CM

## 2021-01-17 DIAGNOSIS — J3089 Other allergic rhinitis: Secondary | ICD-10-CM

## 2021-01-17 DIAGNOSIS — L853 Xerosis cutis: Secondary | ICD-10-CM

## 2021-01-17 DIAGNOSIS — J4489 Other specified chronic obstructive pulmonary disease: Secondary | ICD-10-CM

## 2021-01-17 NOTE — Progress Notes (Signed)
FOLLOW UP  Date of Service/Encounter:  01/17/21   Assessment:   Asthma-COPD overlap syndrome - reviewed medications  Seasonal and perennial allergic rhinitis (grasses, ragweed, weeds, trees, indoor molds, outdoor molds, dust mites, cat, dog and cockroach)  Dry skin  Plan/Recommendations:   1. Asthma-COPD overlap syndrome (HCC) - Lung testing looked stable today. - It is your [facility] that you need EVERY DAY (not your albuterol). - Daily controller medication(s): Wixela 250/19mcg one puff twice daily EVERY DAY - Prior to physical activity: albuterol 2 puffs 10-15 minutes before physical activity. - Rescue medications: albuterol 4 puffs every 4-6 hours as needed - Asthma control goals:  * Full participation in all desired activities (may need albuterol before activity) * Albuterol use two time or less a week on average (not counting use with activity) * Cough interfering with sleep two time or less a month * Oral steroids no more than once a year * No hospitalizations  2. Chronic rhinitis (grasses, ragweed, weeds, trees, indoor molds, outdoor molds, dust mites, cat, dog and cockroach) - We can avoid allergy shots for now.  - Continue taking: Zyrtec (cetirizine) 10mg  tablet once daily and Flonase (fluticasone) two sprays per nostril daily  - Call if you change your mind about the allergy shots.  - Consider getting a bedside humidifier, but the changing season might make this a moot point.   3. Dry skin - Continue to moisturize regularly. - The addition of the antihistamine will help with the dry skin as well.  4. Return in about 6 months (around 07/20/2021).   Subjective:   Nicholas Sims is a 65 y.o. male presenting today for follow up of  Chief Complaint  Patient presents with  . Asthma    COPD    Nicholas Sims has a history of the following: Patient Active Problem List   Diagnosis Date Noted  . Asthma-COPD overlap syndrome (HCC) 10/25/2020  . Seasonal  and perennial allergic rhinitis 10/25/2020  . Dry skin 10/25/2020    History obtained from: chart review and patient.  Nicholas Sims is a 65 y.o. male presenting for a follow up visit. He was last seen in January 2022 as a New Patient. At that time, he had a spirometry in the 50% range.  There was minimal improvement with albuterol.  He was not taking his Advair every day, so we recommended doing that.  For his rhinitis, he had testing that was positive to multiple indoor and outdoor allergens.  We started him on Zyrtec and Flonase.  He does not think he had a routine basis.  Recommend moisturizing for his dry skin.  We also offered a topical steroid.  Since last visit, he reports he has done well.  He remains very busy.  He is a February 2022 and has mostly been focusing on indoor projects.  Because of the cold temperatures, he cannot do any outdoor project since the pain would not drive correctly.  Asthma/Respiratory Symptom History: His breathing seems to be stable.  He does not complain about it at all. He reports that his breathing "comes and goes". He does have episodes occasionally. He does not use his rescue inhaler much. He only uses the albuterol daily and the [facility] as needed. He has them mixed up completely.  He has not required any prednisone and has not been to the emergency room.  Allergic Rhinitis Symptom History: He has not required any antibiotics or prednisone.. These are fairly well controlled. He is using a  space heater in his home. This results in dry air and he is dried out in the morning. He does not have a humidifier that he could add. He is on the fluticasone and the cetirizine. He does report some rhinorrhea with the nose spray. He is doing one spray per nostril daily. He has not needed antibiotics at all.   Otherwise, there have been no changes to his past medical history, surgical history, family history, or social history.    Review of Systems  Constitutional: Negative.   Negative for chills, fever, malaise/fatigue and weight loss.  HENT: Negative.  Negative for congestion, ear discharge, ear pain, sinus pain and sore throat.   Eyes: Negative for pain, discharge and redness.  Respiratory: Positive for cough and shortness of breath. Negative for sputum production and wheezing.   Cardiovascular: Negative.  Negative for chest pain and palpitations.  Gastrointestinal: Negative for abdominal pain, constipation, diarrhea, heartburn, nausea and vomiting.  Skin: Negative.  Negative for itching and rash.  Neurological: Negative for dizziness and headaches.  Endo/Heme/Allergies: Negative for environmental allergies. Does not bruise/bleed easily.       Objective:   Blood pressure (!) 150/84, pulse (!) 57, temperature 98.3 F (36.8 C), temperature source Temporal, resp. rate 12, height 6\' 2"  (1.88 m), weight 262 lb 3.2 oz (118.9 kg), SpO2 98 %. Body mass index is 33.66 kg/m.   Physical Exam:  Physical Exam Constitutional:      Appearance: He is well-developed.     Comments: Pleasant male.  Grateful.  HENT:     Head: Normocephalic and atraumatic.     Right Ear: Tympanic membrane, ear canal and external ear normal.     Left Ear: Tympanic membrane, ear canal and external ear normal.     Nose: No nasal deformity, septal deviation, mucosal edema or rhinorrhea.     Right Turbinates: Enlarged and swollen.     Left Turbinates: Enlarged and swollen.     Right Sinus: No maxillary sinus tenderness or frontal sinus tenderness.     Left Sinus: No maxillary sinus tenderness or frontal sinus tenderness.     Mouth/Throat:     Mouth: Mucous membranes are not pale and not dry.     Pharynx: Uvula midline.  Eyes:     General:        Right eye: No discharge.        Left eye: No discharge.     Conjunctiva/sclera: Conjunctivae normal.     Right eye: Right conjunctiva is not injected. No chemosis.    Left eye: Left conjunctiva is not injected. No chemosis.    Pupils: Pupils  are equal, round, and reactive to light.  Cardiovascular:     Rate and Rhythm: Normal rate and regular rhythm.     Heart sounds: Normal heart sounds.  Pulmonary:     Effort: Pulmonary effort is normal. No tachypnea, accessory muscle usage or respiratory distress.     Breath sounds: Normal breath sounds. No wheezing, rhonchi or rales.     Comments: Scattered wheezes.  Breathing comfortably, however. Chest:     Chest wall: No tenderness.  Lymphadenopathy:     Cervical: No cervical adenopathy.  Skin:    General: Skin is warm.     Capillary Refill: Capillary refill takes less than 2 seconds.     Coloration: Skin is not pale.     Findings: No abrasion, erythema, petechiae or rash. Rash is not papular, urticarial or vesicular.     Comments: No  eczematous or urticarial lesions noted.  Neurological:     Mental Status: He is alert.  Psychiatric:        Behavior: Behavior is cooperative.      Diagnostic studies:    Spirometry: results abnormal (FEV1: 1.49/42%, FVC: 47%, FEV1/FVC: 70%).     Spirometry consistent with possible restrictive disease.  Overall, values are slightly lower than they were previously, but this should be noted that he is not taking his Wixela on a daily basis.   Allergy Studies: none       Nicholas Bonds, MD  [facility]

## 2021-01-17 NOTE — Patient Instructions (Addendum)
1. Asthma-COPD overlap syndrome (HCC) - Lung testing looked stable today. - It is your [facility] that you need EVERY DAY (not your albuterol). - Daily controller medication(s): Wixela 250/51mcg one puff twice daily EVERY DAY - Prior to physical activity: albuterol 2 puffs 10-15 minutes before physical activity. - Rescue medications: albuterol 4 puffs every 4-6 hours as needed - Asthma control goals:  * Full participation in all desired activities (may need albuterol before activity) * Albuterol use two time or less a week on average (not counting use with activity) * Cough interfering with sleep two time or less a month * Oral steroids no more than once a year * No hospitalizations  2. Chronic rhinitis (grasses, ragweed, weeds, trees, indoor molds, outdoor molds, dust mites, cat, dog and cockroach) - We can avoid allergy shots for now.  - Continue taking: Zyrtec (cetirizine) 10mg  tablet once daily and Flonase (fluticasone) two sprays per nostril daily  - Call if you change your mind about the allergy shots.  - Consider getting a bedside humidifier, but the changing season might make this a moot point.   3. Dry skin - Continue to moisturize regularly. - The addition of the antihistamine will help with the dry skin as well.  4. Return in about 6 months (around 07/20/2021).    Please inform 07/22/2021 of any Emergency Department visits, hospitalizations, or changes in symptoms. Call us before going to the ED for breathing or allergy symptoms since we might be able to fit you in for a sick visit. Feel free to contact us anytime with any questions, problems, or concerns.  It was a pleasure to see you again today!  Websites that have reliable patient information: 1. American Academy of Asthma, Allergy, and Immunology: www.aaaai.org 2. Food Allergy Research and Education (FARE): foodallergy.org 3. Mothers of Asthmatics: http://www.asthmacommunitynetwork.org 4. American College of Allergy, Asthma,  and Immunology: www.acaai.org   COVID-19 Vaccine Information can be found at: [place] For questions related to vaccine distribution or appointments, please email [email or web address] or call [phone].   We realize that you might be concerned about having an allergic reaction to the COVID19 vaccines. To help with that concern, WE ARE OFFERING THE COVID19 VACCINES IN OUR OFFICE! Ask the front desk for dates!     "Like" [phone] on Facebook and Instagram for our latest updates!      A healthy democracy works best when [place] participate! Make sure you are registered to vote! If you have moved or changed any of your contact information, you will need to get this updated before voting!  In some cases, you MAY be able to register to vote online: [employer]

## 2021-05-04 ENCOUNTER — Encounter: Payer: Self-pay | Admitting: Allergy & Immunology

## 2021-05-04 ENCOUNTER — Ambulatory Visit (INDEPENDENT_AMBULATORY_CARE_PROVIDER_SITE_OTHER): Payer: No Typology Code available for payment source | Admitting: Family Medicine

## 2021-05-04 ENCOUNTER — Other Ambulatory Visit: Payer: Self-pay

## 2021-05-04 VITALS — BP 140/80 | HR 55 | Temp 98.6°F | Resp 18 | Ht 74.0 in | Wt 256.0 lb

## 2021-05-04 DIAGNOSIS — H1013 Acute atopic conjunctivitis, bilateral: Secondary | ICD-10-CM

## 2021-05-04 DIAGNOSIS — K219 Gastro-esophageal reflux disease without esophagitis: Secondary | ICD-10-CM | POA: Diagnosis not present

## 2021-05-04 DIAGNOSIS — J302 Other seasonal allergic rhinitis: Secondary | ICD-10-CM

## 2021-05-04 DIAGNOSIS — H1010 Acute atopic conjunctivitis, unspecified eye: Secondary | ICD-10-CM

## 2021-05-04 DIAGNOSIS — J3089 Other allergic rhinitis: Secondary | ICD-10-CM

## 2021-05-04 DIAGNOSIS — J449 Chronic obstructive pulmonary disease, unspecified: Secondary | ICD-10-CM

## 2021-05-04 DIAGNOSIS — J4489 Other specified chronic obstructive pulmonary disease: Secondary | ICD-10-CM

## 2021-05-04 MED ORDER — FLUTICASONE PROPIONATE 50 MCG/ACT NA SUSP: 2.0000 | Freq: Every day | NASAL | 5 refills | Status: AC

## 2021-05-04 MED ORDER — CETIRIZINE HCL 10 MG PO TABS: 10.0000 mg | ORAL_TABLET | Freq: Every day | ORAL | 5 refills | Status: AC

## 2021-05-04 MED ORDER — FLUTICASONE-SALMETEROL 250-50 MCG/ACT IN AEPB: 1.0000 | INHALATION_SPRAY | Freq: Two times a day (BID) | RESPIRATORY_TRACT | 5 refills | Status: AC

## 2021-05-04 NOTE — Addendum Note (Signed)
Addended by: Grier Rocher on: 05/04/2021 12:58 PM   Modules accepted: Orders

## 2021-05-04 NOTE — Patient Instructions (Addendum)
Asthma/COPD overlap Increase Wixela 250-1 puff twice a day to prevent cough or wheeze Continue albuterol 2 puffs once every 4 hours as needed for cough or wheeze You may use albuterol 2 puffs 5 to 15 minutes before activity to decrease cough or wheeze  Allergic rhinitis Continue allergen avoidance measures directed toward grass pollen, weed pollen, ragweed pollen, tree pollen, mold, dust mite, cat, dog, and cockroach as listed below Continue cetirizine 10 mg once a day as needed for runny nose or itch Continue Flonase 2 sprays in each nostril once a day as needed for stuffy nose. In the right nostril, point the applicator out toward the right ear. In the left nostril, point the applicator out toward the left ear Consider saline nasal rinses as needed for nasal symptoms. Use this before any medicated nasal sprays for best result Consider allergen immunotherapy if the treatment plan as listed above does not control your symptoms of allergic rhinitis  Allergic conjunctivitis Begin olopatadine 0.1% 1 drop in each eye twice a day as needed for red or itchy eyes  Reflux Continue dietary and lifestyle modifications as listed below  Call the clinic if this treatment plan is not working well for you.  Follow up in 6 months or sooner if needed.  Reducing Pollen Exposure The American Academy of Allergy, Asthma and Immunology suggests the following steps to reduce your exposure to pollen during allergy seasons. Do not hang sheets or clothing out to dry; pollen may collect on these items. Do not mow lawns or spend time around freshly cut grass; mowing stirs up pollen. Keep windows closed at night.  Keep car windows closed while driving. Minimize morning activities outdoors, a time when pollen counts are usually at their highest. Stay indoors as much as possible when pollen counts or humidity is high and on windy days when pollen tends to remain in the air longer. Use air conditioning when possible.   Many air conditioners have filters that trap the pollen spores. Use a HEPA room air filter to remove pollen form the indoor air you breathe.  Control of Dog or Cat Allergen Avoidance is the best way to manage a dog or cat allergy. If you have a dog or cat and are allergic to dog or cats, consider removing the dog or cat from the home. If you have a dog or cat but don't want to find it a new home, or if your family wants a pet even though someone in the household is allergic, here are some strategies that may help keep symptoms at bay:  Keep the pet out of your bedroom and restrict it to only a few rooms. Be advised that keeping the dog or cat in only one room will not limit the allergens to that room. Don't pet, hug or kiss the dog or cat; if you do, wash your hands with soap and water. High-efficiency particulate air (HEPA) cleaners run continuously in a bedroom or living room can reduce allergen levels over time. Regular use of a high-efficiency vacuum cleaner or a central vacuum can reduce allergen levels. Giving your dog or cat a bath at least once a week can reduce airborne allergen.  Control of Mold Allergen Mold and fungi can grow on a variety of surfaces provided certain temperature and moisture conditions exist.  Outdoor molds grow on plants, decaying vegetation and soil.  The major outdoor mold, Alternaria and Cladosporium, are found in very high numbers during hot and dry conditions.  Generally, a late  Summer - Fall peak is seen for common outdoor fungal spores.  Rain will temporarily lower outdoor mold spore count, but counts rise rapidly when the rainy period ends.  The most important indoor molds are Aspergillus and Penicillium.  Dark, humid and poorly ventilated basements are ideal sites for mold growth.  The next most common sites of mold growth are the bathroom and the kitchen.  Outdoor [employer] Use air conditioning and keep windows closed Avoid exposure to decaying  vegetation. Avoid leaf raking. Avoid grain handling. Consider wearing a face mask if working in moldy areas.  Indoor Mold Control Maintain humidity below 50%. Clean washable surfaces with 5% bleach solution. Remove sources e.g. Contaminated carpets.   Control of Dust Mite Allergen Dust mites play a major role in allergic asthma and rhinitis. They occur in environments with high humidity wherever human skin is found. Dust mites absorb humidity from the atmosphere (ie, they do not drink) and feed on organic matter (including shed human and animal skin). Dust mites are a microscopic type of insect that you cannot see with the naked eye. High levels of dust mites have been detected from mattresses, pillows, carpets, upholstered furniture, bed covers, clothes, soft toys and any woven material. The principal allergen of the dust mite is found in its feces. A gram of dust may contain 1,000 mites and 250,000 fecal particles. Mite antigen is easily measured in the air during house cleaning activities. Dust mites do not bite and do not cause harm to humans, other than by triggering allergies/asthma.  Ways to decrease your exposure to dust mites in your home:  1. Encase mattresses, box springs and pillows with a mite-impermeable barrier or cover  2. Wash sheets, blankets and drapes weekly in hot water (130 F) with detergent and dry them in a dryer on the hot setting.  3. Have the room cleaned frequently with a vacuum cleaner and a damp dust-mop. For carpeting or rugs, vacuuming with a vacuum cleaner equipped with a high-efficiency particulate air (HEPA) filter. The dust mite allergic individual should not be in a room which is being cleaned and should wait 1 hour after cleaning before going into the room.  4. Do not sleep on upholstered furniture (eg, couches).  5. If possible removing carpeting, upholstered furniture and drapery from the home is ideal. Horizontal blinds should be eliminated in the  rooms where the person spends the most time (bedroom, study, television room). Washable vinyl, roller-type shades are optimal.  6. Remove all non-washable stuffed toys from the bedroom. Wash stuffed toys weekly like sheets and blankets above.  7. Reduce indoor humidity to less than 50%. Inexpensive humidity monitors can be purchased at most hardware stores. Do not use a humidifier as can make the problem worse and are not recommended.  Control of Cockroach Allergen Cockroach allergen has been identified as an important cause of acute attacks of asthma, especially in urban settings.  There are fifty-five species of cockroach that exist in the [place], however only three, the [place], [place] species produce allergen that can affect patients with Asthma.  Allergens can be obtained from fecal particles, egg casings and secretions from cockroaches.    Remove food sources. Reduce access to water. Seal access and entry points. Spray runways with 0.5-1% Diazinon or Chlorpyrifos Blow boric acid power under stoves and refrigerator. Place bait stations (hydramethylnon) at feeding sites.

## 2021-05-04 NOTE — Progress Notes (Signed)
[address] [place] [address] Dept: [phone]  FOLLOW UP NOTE  Patient ID: Nicholas Sims, male    DOB: 01/22/1956  Age: 65 y.o. MRN: [id number] Date of Office Visit: 05/04/2021  Assessment  Chief Complaint: Asthma (ACT - 17 ) and Allergic Rhinitis  (Wheezing, coughing, congestion, and some shortness of breath )  HPI Nicholas Sims is a 65 year old male who presents to the clinic for follow-up visit.  He was last seen in this clinic on 01/17/2021 by Dr. Dellis Anes for evaluation of asthma and allergic rhinitis.  At today's visit, he reports his asthma has been moderately well controlled with shortness of breath and cough occurring about 4 times a month.  He reports this can be triggered by lying down or with intense activity.  He reports wheezing continuously.  He continues Wixela about 2 days a week and uses albuterol about 4 times a month with relief of symptoms.  He reports some asthma triggers include odors and pollen season.  Reflux is reported as moderately well controlled with infrequent heartburn for which he takes an over-the-counter medication with relief of symptoms.  Allergic rhinitis is reported as moderately well controlled at this time with symptoms including clear rhinorrhea, nasal congestion occurring in the morning and occasional sneezing spells.  He does report an increase in the symptoms between March and May each year.  He continues cetirizine and Flonase as needed.  He reports poor application technique with Flonase.  Allergic conjunctivitis is reported as poorly controlled with red and itchy eyes occurring frequently.  He is not currently using any medical intervention for allergic conjunctivitis.  His current medications are listed in the chart.   Drug Allergies:  Allergies  Allergen Reactions   Penicillins     Physical Exam: BP 140/80   Pulse (!) 55   Temp 98.6 F (37 C)   Resp 18   Ht 6\' 2"  (1.88 m)   Wt 256 lb (116.1 kg)   SpO2 97%    BMI 32.87 kg/m    Physical Exam Vitals reviewed.  Constitutional:      Appearance: Normal appearance.  HENT:     Head: Normocephalic and atraumatic.     Right Ear: Tympanic membrane normal.     Left Ear: Tympanic membrane normal.     Nose:     Comments: Bilateral nares edematous and pale with clear nasal drainage noted.  Pharynx normal.  Ears normal.  Eyes normal.    Mouth/Throat:     Pharynx: Oropharynx is clear.  Eyes:     Conjunctiva/sclera: Conjunctivae normal.  Cardiovascular:     Rate and Rhythm: Normal rate and regular rhythm.     Heart sounds: Normal heart sounds. No murmur heard. Pulmonary:     Effort: Pulmonary effort is normal.     Breath sounds: Normal breath sounds.     Comments: Lungs clear to auscultation Musculoskeletal:        General: Normal range of motion.     Cervical back: Normal range of motion and neck supple.  Skin:    General: Skin is warm and dry.  Neurological:     Mental Status: He is alert and oriented to person, place, and time.  Psychiatric:        Mood and Affect: Mood normal.        Behavior: Behavior normal.        Thought Content: Thought content normal.        Judgment: Judgment normal.  Diagnostics: FVC 2.54, FEV1 1.74.  Predicted FVC 4.04, predicted FEV1 3.11.  Spirometry indicates moderate restriction.  This is improved from his last spirometry reading.  Assessment and Plan: 1. Asthma-COPD overlap syndrome (HCC)   2. Seasonal and perennial allergic rhinitis   3. Seasonal allergic conjunctivitis   4. Gastroesophageal reflux disease, unspecified whether esophagitis present     Meds ordered this encounter  Medications   cetirizine (ZYRTEC) 10 MG tablet    Sig: Take 1 tablet (10 mg total) by mouth daily.    Dispense:  30 tablet    Refill:  5   fluticasone (FLONASE) 50 MCG/ACT nasal spray    Sig: Place 2 sprays into both nostrils daily.    Dispense:  16 g    Refill:  5   fluticasone-salmeterol (WIXELA INHUB) 250-50  MCG/ACT AEPB    Sig: Inhale 1 puff into the lungs in the morning and at bedtime.    Dispense:  60 each    Refill:  5     Patient Instructions  Asthma/COPD overlap Increase Wixela 250-1 puff twice a day to prevent cough or wheeze Continue albuterol 2 puffs once every 4 hours as needed for cough or wheeze You may use albuterol 2 puffs 5 to 15 minutes before activity to decrease cough or wheeze  Allergic rhinitis Continue allergen avoidance measures directed toward grass pollen, weed pollen, ragweed pollen, tree pollen, mold, dust mite, cat, dog, and cockroach as listed below Continue cetirizine 10 mg once a day as needed for runny nose or itch Continue Flonase 2 sprays in each nostril once a day as needed for stuffy nose. In the right nostril, point the applicator out toward the right ear. In the left nostril, point the applicator out toward the left ear Consider saline nasal rinses as needed for nasal symptoms. Use this before any medicated nasal sprays for best result Consider allergen immunotherapy if the treatment plan as listed above does not control your symptoms of allergic rhinitis  Allergic conjunctivitis Begin olopatadine 0.1% 1 drop in each eye twice a day as needed for red or itchy eyes  Reflux Continue dietary and lifestyle modifications as listed below  Call the clinic if this treatment plan is not working well for you.  Follow up in 6 months or sooner if needed.   Return in about 6 months (around 11/04/2021), or if symptoms worsen or fail to improve.    Thank you for the opportunity to care for this patient.  Please do not hesitate to contact me with questions.  Thermon Leyland, FNP [facility]

## 2021-10-11 ENCOUNTER — Telehealth: Payer: Self-pay | Admitting: Allergy & Immunology

## 2021-10-11 NOTE — Telephone Encounter (Signed)
Faxed renewal of authorization request to [facility], [phone]

## 2021-10-31 NOTE — Telephone Encounter (Signed)
Called [facility] to check on renewal of authorization request, spoke to Chelsea. Inetta Fermo let me know authorization request had not been received.   I have refaxed it to [phone] & I have emailed it to Nurse Gold ([email or web address]), Nurse Clarisse Gouge ([email or web address]) and [email or web address].

## 2021-11-07 ENCOUNTER — Ambulatory Visit: Payer: No Typology Code available for payment source | Admitting: Allergy & Immunology

## 2021-11-16 NOTE — Telephone Encounter (Signed)
Received updated authorization ([id number]). Authorization has been updated in system.

## 2022-03-24 ENCOUNTER — Ambulatory Visit
Admission: EM | Admit: 2022-03-24 | Discharge: 2022-03-24 | Disposition: A | Discharge status: Home / Self Care | Payer: No Typology Code available for payment source | Attending: Family Medicine | Admitting: Family Medicine

## 2022-03-24 DIAGNOSIS — N390 Urinary tract infection, site not specified: Secondary | ICD-10-CM | POA: Diagnosis present

## 2022-03-24 LAB — POCT URINALYSIS DIP (MANUAL ENTRY)
Bilirubin, UA: NEGATIVE
Glucose, UA: NEGATIVE mg/dL
Ketones, POC UA: NEGATIVE mg/dL
Nitrite, UA: POSITIVE — AB
Protein Ur, POC: >=300 mg/dL — AB
Spec Grav, UA: 1.02 (ref 1.010–1.025)
Urobilinogen, UA: 1 E.U./dL
pH, UA: 6 (ref 5.0–8.0)

## 2022-03-24 MED ORDER — SULFAMETHOXAZOLE-TRIMETHOPRIM 800-160 MG PO TABS: 1.0000 | ORAL_TABLET | Freq: Two times a day (BID) | ORAL | 0 refills | Status: AC

## 2022-03-24 NOTE — ED Triage Notes (Signed)
Pt states that last Friday he started having chills and then sweats, headaches and body aches  Pt states he tried some OTC meds and nothing worked  [employer] he has a history of UTI's and he thinks that is what is going on

## 2022-03-24 NOTE — ED Provider Notes (Signed)
[facility]    CSN: [id number] Arrival date & time: 03/24/22  0856      History   Chief Complaint Chief Complaint  Patient presents with   Fever    Headache, body aches and fever    HPI Nicholas Sims is a 66 y.o. male.   Presenting today with chills, sweats, fever, headache, body aches, dysuria, urinary frequency worsening since Friday.  Denies bowel changes, abdominal pain, nausea, vomiting, upper respiratory symptoms.  States he has a history of urinary tract infections that have felt similar and thinks this might be what is going on.  Trying Tylenol with no relief.   Past Medical History:  Diagnosis Date   Asthma-COPD overlap syndrome (HCC) 10/25/2020   Gout     Patient Active Problem List   Diagnosis Date Noted   Asthma-COPD overlap syndrome (HCC) 10/25/2020   Seasonal and perennial allergic rhinitis 10/25/2020   Dry skin 10/25/2020    History reviewed. No pertinent surgical history.     Home Medications    Prior to Admission medications   Medication Sig Start Date End Date Taking? Authorizing Provider  sulfamethoxazole-trimethoprim (BACTRIM DS) 800-160 MG tablet Take 1 tablet by mouth 2 (two) times daily for 7 days. 03/24/22 03/31/22 Yes Particia Nearing, PA-C  acetaminophen (TYLENOL) 500 MG tablet Take 500 mg by mouth every 6 (six) hours as needed.    [provider]  albuterol (VENTOLIN HFA) 108 (90 Base) MCG/ACT inhaler Inhale into the lungs. 03/20/21   [provider]  cetirizine (ZYRTEC) 10 MG tablet Take 1 tablet (10 mg total) by mouth daily. 05/04/21   Hetty Blend, FNP  fluticasone (FLONASE) 50 MCG/ACT nasal spray Place 2 sprays into both nostrils daily. 05/04/21   Ambs, Norvel Richards, FNP  fluticasone-salmeterol (WIXELA INHUB) 250-50 MCG/ACT AEPB Inhale 1 puff into the lungs in the morning and at bedtime. 05/04/21   Hetty Blend, FNP  furosemide (LASIX) 20 MG tablet Take 20 mg by mouth as needed.    [provider]   losartan (COZAAR) 50 MG tablet Take 50 mg by mouth daily.    [provider]  phenazopyridine (PYRIDIUM) 100 MG tablet Take 1 tablet (100 mg total) by mouth 3 (three) times daily as needed for pain. Patient not taking: Reported on 05/04/2021 10/10/19   Durward Parcel, FNP  potassium chloride (MICRO-K) 10 MEQ CR capsule Take 10 mEq by mouth 2 (two) times daily.    [provider]  rosuvastatin (CRESTOR) 10 MG tablet Take 10 mg by mouth daily.    [provider]  tamsulosin (FLOMAX) 0.4 MG CAPS capsule Take 0.4 mg by mouth.    [provider]    Family History Family History  Problem Relation Age of Onset   Diabetes Mother    COPD Father     Social History Social History   Tobacco Use   Smoking status: Former   Smokeless tobacco: Never  Building services engineer Use: Never used  Substance Use Topics   Alcohol use: Not Currently   Drug use: Not Currently     Allergies   Penicillins   Review of Systems Review of Systems Per HPI  Physical Exam Triage Vital Signs ED Triage Vitals  Enc Vitals Group     BP 03/24/22 0930 129/81     Pulse Rate 03/24/22 0930 70     Resp 03/24/22 0930 20     Temp 03/24/22 0930 98.7 F (37.1 C)  Temp Source 03/24/22 0930 Oral     SpO2 03/24/22 0930 93 %     Weight --      Height --      Head Circumference --      Peak Flow --      Pain Score 03/24/22 0927 8     Pain Loc --      Pain Edu? --      Excl. in GC? --    No data found.  Updated Vital Signs BP 129/81 (BP Location: Right Arm)   Pulse 70   Temp 98.7 F (37.1 C) (Oral)   Resp 20   SpO2 93%   Visual Acuity Right Eye Distance:   Left Eye Distance:   Bilateral Distance:    Right Eye Near:   Left Eye Near:    Bilateral Near:     Physical Exam Vitals and nursing note reviewed.  Constitutional:      Appearance: Normal appearance.  HENT:     Head: Atraumatic.  Eyes:     Extraocular Movements: Extraocular movements intact.      Conjunctiva/sclera: Conjunctivae normal.  Cardiovascular:     Rate and Rhythm: Normal rate and regular rhythm.  Pulmonary:     Effort: Pulmonary effort is normal.     Breath sounds: Normal breath sounds.  Abdominal:     General: Bowel sounds are normal. There is no distension.     Palpations: Abdomen is soft.     Tenderness: There is no abdominal tenderness. There is no right CVA tenderness, left CVA tenderness or guarding.  Musculoskeletal:        General: Normal range of motion.     Cervical back: Normal range of motion and neck supple.  Skin:    General: Skin is warm and dry.  Neurological:     General: No focal deficit present.     Mental Status: He is oriented to person, place, and time.  Psychiatric:        Mood and Affect: Mood normal.        Thought Content: Thought content normal.        Judgment: Judgment normal.     UC Treatments / Results  Labs (all labs ordered are listed, but only abnormal results are displayed) Labs Reviewed  POCT URINALYSIS DIP (MANUAL ENTRY) - Abnormal; Notable for the following components:      Result Value   Clarity, UA cloudy (*)    Blood, UA large (*)    Protein Ur, POC >=300 (*)    Nitrite, UA Positive (*)    Leukocytes, UA Large (3+) (*)    All other components within normal limits  URINE CULTURE    EKG   Radiology No results found.  Procedures Procedures (including critical care time)  Medications Ordered in UC Medications - No data to display  Initial Impression / Assessment and Plan / UC Course  I have reviewed the triage vital signs and the nursing notes.  Pertinent labs & imaging results that were available during my care of the patient were reviewed by me and considered in my medical decision making (see chart for details).     Urinalysis with evidence of urinary tract infection, vitals and exam overall reassuring.  Urine culture pending, treat with Bactrim while awaiting results.  Push fluids, return for  worsening symptoms.  Final Clinical Impressions(s) / UC Diagnoses   Final diagnoses:  Acute lower UTI   Discharge Instructions   None  ED Prescriptions     Medication Sig Dispense Auth. Provider   sulfamethoxazole-trimethoprim (BACTRIM DS) 800-160 MG tablet Take 1 tablet by mouth 2 (two) times daily for 7 days. 14 tablet Particia Nearing, [place]      PDMP not reviewed this encounter.   Particia Nearing, [place] 03/24/22 1003

## 2022-03-26 LAB — URINE CULTURE: Culture: >=100000 — AB

## 2022-09-04 ENCOUNTER — Other Ambulatory Visit: Payer: Self-pay

## 2022-09-04 ENCOUNTER — Emergency Department (HOSPITAL_COMMUNITY)
Admission: EM | Admit: 2022-09-04 | Discharge: 2022-09-05 | Disposition: A | Discharge status: Home / Self Care | Payer: No Typology Code available for payment source | Attending: Emergency Medicine | Admitting: Emergency Medicine

## 2022-09-04 ENCOUNTER — Encounter (HOSPITAL_COMMUNITY): Payer: Self-pay

## 2022-09-04 ENCOUNTER — Emergency Department (HOSPITAL_COMMUNITY): Payer: No Typology Code available for payment source

## 2022-09-04 DIAGNOSIS — Z7951 Long term (current) use of inhaled steroids: Secondary | ICD-10-CM | POA: Insufficient documentation

## 2022-09-04 DIAGNOSIS — N189 Chronic kidney disease, unspecified: Secondary | ICD-10-CM | POA: Diagnosis not present

## 2022-09-04 DIAGNOSIS — J45909 Unspecified asthma, uncomplicated: Secondary | ICD-10-CM | POA: Insufficient documentation

## 2022-09-04 DIAGNOSIS — J449 Chronic obstructive pulmonary disease, unspecified: Secondary | ICD-10-CM | POA: Insufficient documentation

## 2022-09-04 DIAGNOSIS — N390 Urinary tract infection, site not specified: Secondary | ICD-10-CM

## 2022-09-04 DIAGNOSIS — N3001 Acute cystitis with hematuria: Secondary | ICD-10-CM | POA: Diagnosis not present

## 2022-09-04 DIAGNOSIS — I129 Hypertensive chronic kidney disease with stage 1 through stage 4 chronic kidney disease, or unspecified chronic kidney disease: Secondary | ICD-10-CM | POA: Diagnosis not present

## 2022-09-04 DIAGNOSIS — N452 Orchitis: Secondary | ICD-10-CM | POA: Diagnosis not present

## 2022-09-04 DIAGNOSIS — Z79899 Other long term (current) drug therapy: Secondary | ICD-10-CM | POA: Insufficient documentation

## 2022-09-04 DIAGNOSIS — R519 Headache, unspecified: Secondary | ICD-10-CM | POA: Diagnosis present

## 2022-09-04 HISTORY — DX: Urinary tract infection, site not specified: N39.0

## 2022-09-04 LAB — COMPREHENSIVE METABOLIC PANEL
ALT: 16 U/L (ref 0–44)
AST: 20 U/L (ref 15–41)
Albumin: 3.8 g/dL (ref 3.5–5.0)
Alkaline Phosphatase: 54 U/L (ref 38–126)
Anion gap: 5 (ref 5–15)
BUN: 21 mg/dL (ref 8–23)
CO2: 25 mmol/L (ref 22–32)
Calcium: 9.1 mg/dL (ref 8.9–10.3)
Chloride: 109 mmol/L (ref 98–111)
Creatinine, Ser: 1.63 mg/dL — ABNORMAL HIGH (ref 0.61–1.24)
GFR, Estimated: 46 mL/min — ABNORMAL LOW (ref >60)
Glucose, Bld: 108 mg/dL — ABNORMAL HIGH (ref 70–99)
Potassium: 4.3 mmol/L (ref 3.5–5.1)
Sodium: 139 mmol/L (ref 135–145)
Total Bilirubin: 0.9 mg/dL (ref 0.3–1.2)
Total Protein: 7.6 g/dL (ref 6.5–8.1)

## 2022-09-04 LAB — URINALYSIS, ROUTINE W REFLEX MICROSCOPIC
Bilirubin Urine: NEGATIVE
Glucose, UA: NEGATIVE mg/dL
Ketones, ur: NEGATIVE mg/dL
Nitrite: POSITIVE — AB
Protein, ur: >=300 mg/dL — AB
Specific Gravity, Urine: 1.01 (ref 1.005–1.030)
pH: 5 (ref 5.0–8.0)

## 2022-09-04 LAB — CBC WITH DIFFERENTIAL/PLATELET
Abs Immature Granulocytes: 0.06 10*3/uL (ref 0.00–0.07)
Basophils Absolute: 0 10*3/uL (ref 0.0–0.1)
Basophils Relative: 0 %
Eosinophils Absolute: 0 10*3/uL (ref 0.0–0.5)
Eosinophils Relative: 0 %
HCT: 40.1 % (ref 39.0–52.0)
Hemoglobin: 13.2 g/dL (ref 13.0–17.0)
Immature Granulocytes: 0 %
Lymphocytes Relative: 9 %
Lymphs Abs: 1.3 10*3/uL (ref 0.7–4.0)
MCH: 30.4 pg (ref 26.0–34.0)
MCHC: 32.9 g/dL (ref 30.0–36.0)
MCV: 92.4 fL (ref 80.0–100.0)
Monocytes Absolute: 0.8 10*3/uL (ref 0.1–1.0)
Monocytes Relative: 6 %
Neutro Abs: 13 10*3/uL — ABNORMAL HIGH (ref 1.7–7.7)
Neutrophils Relative %: 85 %
Platelets: 196 10*3/uL (ref 150–400)
RBC: 4.34 MIL/uL (ref 4.22–5.81)
RDW: 13.1 % (ref 11.5–15.5)
WBC: 15.3 10*3/uL — ABNORMAL HIGH (ref 4.0–10.5)
nRBC: 0 % (ref 0.0–0.2)

## 2022-09-04 LAB — LIPASE, BLOOD: Lipase: 26 U/L (ref 11–51)

## 2022-09-04 MED ORDER — IOHEXOL 300 MG/ML  SOLN
100.0000 mL | Freq: Once | INTRAMUSCULAR | Status: AC | PRN
  Administered 2022-09-04: 100 mL via INTRAVENOUS

## 2022-09-04 MED ORDER — SODIUM CHLORIDE 0.9 % IV BOLUS
1000.0000 mL | Freq: Once | INTRAVENOUS | Status: AC
  Administered 2022-09-04: 1000 mL via INTRAVENOUS

## 2022-09-04 MED ORDER — SODIUM CHLORIDE 0.9 % IV SOLN
2.0000 g | Freq: Once | INTRAVENOUS | Status: AC
  Administered 2022-09-05: 2 g via INTRAVENOUS
  Filled 2022-09-04: qty 20

## 2022-09-04 MED ORDER — HYDROMORPHONE HCL 1 MG/ML IJ SOLN
1.0000 mg | Freq: Once | INTRAMUSCULAR | Status: AC
  Administered 2022-09-04: 1 mg via INTRAVENOUS
  Filled 2022-09-04: qty 1

## 2022-09-04 MED ORDER — ONDANSETRON HCL 4 MG/2ML IJ SOLN
4.0000 mg | Freq: Once | INTRAMUSCULAR | Status: AC
  Administered 2022-09-04: 4 mg via INTRAVENOUS
  Filled 2022-09-04: qty 2

## 2022-09-04 NOTE — ED Provider Notes (Signed)
11:41 PM Assumed care from Dr. Wallace Cullens, please see their note for full history, physical and decision making until this point. In brief this is a 66 y.o. year old male who presented to the ED tonight with Headache, Testicle Pain, and Generalized Body Aches     Has orchitis/UTI, pending CT scan abx given. Likely discharge if feeling bettera nd CT reassuring.   Discharge instructions, including strict return precautions for new or worsening symptoms, given. Patient and/or family verbalized understanding and agreement with the plan as described.   Labs, studies and imaging reviewed by myself and considered in medical decision making if ordered. Imaging interpreted by radiology.  Labs Reviewed  CBC WITH DIFFERENTIAL/PLATELET - Abnormal; Notable for the following components:      Result Value   WBC 15.3 (*)    Neutro Abs 13.0 (*)    All other components within normal limits  COMPREHENSIVE METABOLIC PANEL - Abnormal; Notable for the following components:   Glucose, Bld 108 (*)    Creatinine, Ser 1.63 (*)    GFR, Estimated 46 (*)    All other components within normal limits  URINALYSIS, ROUTINE W REFLEX MICROSCOPIC - Abnormal; Notable for the following components:   APPearance HAZY (*)    Hgb urine dipstick LARGE (*)    Protein, ur >=300 (*)    Nitrite POSITIVE (*)    Leukocytes,Ua MODERATE (*)    Bacteria, UA MANY (*)    All other components within normal limits  URINE CULTURE  LIPASE, BLOOD    US SCROTUM W/DOPPLER  Final Result    CT ABDOMEN PELVIS W CONTRAST    (Results Pending)    No follow-ups on file.

## 2022-09-04 NOTE — ED Triage Notes (Signed)
Pt reports pain in right side and swollen left testicle. Pt has cold chills. Hx of UTI. Pt states he just had a kidney biopsy and  it was negative. Pt reports a headache

## 2022-09-04 NOTE — ED Notes (Signed)
Pt taken to [place] with chaperone

## 2022-09-04 NOTE — ED Provider Notes (Signed)
[facility] EMERGENCY DEPARTMENT Provider Note   CSN: [id number] Arrival date & time: 09/04/22  2025     History {Add pertinent medical, surgical, social history, OB history to HPI:1} Chief Complaint  Patient presents with   Headache   Testicle Pain   Generalized Body Aches    Nicholas Sims is a 66 y.o. male.  Patient as above with significant medical history as below, including asthma, copd, recurrent uti, gout, ckd, nephrotic, htn who presents to the ED with complaint of rlq abd pain, right testicle pain. RLQ pain since yesterday afternoon, RLQ sharp stabbing/ radiation to right inguinal area. Began to have right sided testicle pain/swelling over last 12 hours. Reports increased thirst and appetite last 12 hours, feeling hot/cold, no change to bowel function. Had some pain with urination earlier today which has since improved, thinks he may have  had some blood in his urine earlier as well. He had a sharp pain to his urethra earlier when he was urinating but this has since subsided. No concern for STI. No recent testicular trauma. No rash. Not diabetic.      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 No pertinent surgical history.   The history is provided by the patient. No language interpreter was used.  Headache Associated symptoms: abdominal pain, fever and nausea   Associated symptoms: no cough, no photophobia and no vomiting   Testicle Pain Associated symptoms include abdominal pain. Pertinent negatives include no chest pain and no shortness of breath.       Home Medications Prior to Admission medications   Medication Sig Start Date End Date Taking? Authorizing Provider  acetaminophen (TYLENOL) 500 MG tablet Take 500 mg by mouth every 6 (six) hours as needed.    [provider]  albuterol (VENTOLIN HFA) 108 (90 Base) MCG/ACT inhaler Inhale into the  lungs. 03/20/21   [provider]  cetirizine (ZYRTEC) 10 MG tablet Take 1 tablet (10 mg total) by mouth daily. 05/04/21   Hetty Blend, FNP  fluticasone (FLONASE) 50 MCG/ACT nasal spray Place 2 sprays into both nostrils daily. 05/04/21   Ambs, Norvel Richards, FNP  fluticasone-salmeterol (WIXELA INHUB) 250-50 MCG/ACT AEPB Inhale 1 puff into the lungs in the morning and at bedtime. 05/04/21   Hetty Blend, FNP  furosemide (LASIX) 20 MG tablet Take 20 mg by mouth as needed.    [provider]  losartan (COZAAR) 50 MG tablet Take 50 mg by mouth daily.    [provider]  phenazopyridine (PYRIDIUM) 100 MG tablet Take 1 tablet (100 mg total) by mouth 3 (three) times daily as needed for pain. Patient not taking: Reported on 05/04/2021 10/10/19   Durward Parcel, FNP  potassium chloride (MICRO-K) 10 MEQ CR capsule Take 10 mEq by mouth 2 (two) times daily.    [provider]  rosuvastatin (CRESTOR) 10 MG tablet Take 10 mg by mouth daily.    [provider]  tamsulosin (FLOMAX) 0.4 MG CAPS capsule Take 0.4 mg by mouth.    [provider]      Allergies    Penicillins and Statins    Review of Systems   Review of Systems  Constitutional:  Positive for chills and fever.  HENT:  Negative for facial swelling and trouble swallowing.   Eyes:  Negative for photophobia and visual disturbance.  Respiratory:  Negative  for cough and shortness of breath.   Cardiovascular:  Negative for chest pain and palpitations.  Gastrointestinal:  Positive for abdominal pain and nausea. Negative for vomiting.  Endocrine: Negative for polydipsia and polyuria.  Genitourinary:  Positive for dysuria, scrotal swelling and testicular pain. Negative for difficulty urinating and hematuria.  Musculoskeletal:  Negative for gait problem and joint swelling.  Skin:  Negative for pallor and rash.  Neurological:  Negative for syncope.  Psychiatric/Behavioral:  Negative for agitation and  confusion.     Physical Exam Updated Vital Signs BP 136/80 (BP Location: Left Arm)   Pulse 88   Temp 99.7 F (37.6 C) (Oral)   Resp 18   Ht 6\' 2"  (1.88 m)   Wt 122.9 kg   SpO2 94%   BMI 34.79 kg/m  Physical Exam Vitals and nursing note reviewed. Exam conducted with a chaperone present.  Constitutional:      General: He is not in acute distress.    Appearance: He is well-developed.  HENT:     Head: Normocephalic and atraumatic.     Right Ear: External ear normal.     Left Ear: External ear normal.     Mouth/Throat:     Mouth: Mucous membranes are moist.  Eyes:     General: No scleral icterus. Cardiovascular:     Rate and Rhythm: Normal rate and regular rhythm.     Pulses: Normal pulses.     Heart sounds: Normal heart sounds.  Pulmonary:     Effort: Pulmonary effort is normal. No respiratory distress.     Breath sounds: Normal breath sounds.  Abdominal:     General: Abdomen is flat.     Palpations: Abdomen is soft.     Tenderness: There is abdominal tenderness in the right lower quadrant. There is no guarding or rebound.    Genitourinary:    Testes:        Right: Mass, tenderness and swelling present.       Comments: Right testicle is firm, swollen, unable to appreciate cremasteric reflex on right. Skin is erythematous right lateral. Left testicle appears WNL. Penis appears WNL. No rash. Perineum is WNL.  Musculoskeletal:        General: Normal range of motion.     Cervical back: Normal range of motion.     Right lower leg: No edema.     Left lower leg: No edema.  Skin:    General: Skin is warm and dry.     Capillary Refill: Capillary refill takes less than 2 seconds.  Neurological:     Mental Status: He is alert and oriented to person, place, and time.  Psychiatric:        Mood and Affect: Mood normal.        Behavior: Behavior normal.     ED Results / Procedures / Treatments   Labs (all labs ordered are listed, but only abnormal results are  displayed) Labs Reviewed  CBC WITH DIFFERENTIAL/PLATELET  COMPREHENSIVE METABOLIC PANEL  LIPASE, BLOOD  URINALYSIS, ROUTINE W REFLEX MICROSCOPIC    EKG None  Radiology No results found.  Procedures Procedures  {Document cardiac monitor, telemetry assessment procedure when appropriate:1}  Medications Ordered in ED Medications  sodium chloride 0.9 % bolus 1,000 mL (has no administration in time range)  HYDROmorphone (DILAUDID) injection 1 mg (has no administration in time range)  ondansetron (ZOFRAN) injection 4 mg (has no administration in time range)    ED Course/ Medical Decision Making/ A&P  Medical Decision Making Amount and/or Complexity of Data Reviewed Labs: ordered. Radiology: ordered.  Risk Prescription drug management.   This patient presents to the ED with chief complaint(s) of abd pain, testicle pain with pertinent past medical history of ckd, recurrent uti which further complicates the presenting complaint. The complaint involves an extensive differential diagnosis and also carries with it a high risk of complications and morbidity.    The differential diagnosis includes but not limited to Differential diagnosis includes but is not exclusive to acute appendicitis, renal colic, testicular torsion, urinary tract infection, prostatitis,  diverticulitis, small bowel obstruction, colitis, abdominal aortic aneurysm, gastroenteritis, constipation, torsion, epididymitis, testicle abscess, epididymitis, soft tissue infection. Less likely fournier etc. . Serious etiologies were considered.   The initial plan is to screening labs, ct/scrotal US, ivf, analgesia   Additional history obtained: Additional history obtained from  na Records reviewed Care Everywhere/External Records and prior labs, home meds   Independent labs interpretation:  The following labs were independently interpreted: ***  Independent visualization of imaging: - I  independently visualized the following imaging with scope of interpretation limited to determining acute life threatening conditions related to emergency care: ***, which revealed ***  Cardiac monitoring was reviewed and interpreted by myself which shows NSR  Treatment and Reassessment: ***  Consultation: - Consulted or discussed management/test interpretation w/ external professional: ***  Consideration for admission or further workup: Admission was considered ***  Social Determinants of health: Social History   Tobacco Use   Smoking status: Former   Smokeless tobacco: Never  Building services engineer Use: Never used  Substance Use Topics   Alcohol use: Not Currently   Drug use: Not Currently      {Document critical care time when appropriate:1} {Document review of labs and clinical decision tools ie heart score, Chads2Vasc2 etc:1}  {Document your independent review of radiology images, and any outside records:1} {Document your discussion with family members, caretakers, and with consultants:1} {Document social determinants of health affecting pt's care:1} {Document your decision making why or why not admission, treatments were needed:1} Final Clinical Impression(s) / ED Diagnoses Final diagnoses:  None    Rx / DC Orders ED Discharge Orders     None

## 2022-09-04 NOTE — ED Notes (Signed)
[place] from [place]

## 2022-09-05 MED ORDER — OXYCODONE-ACETAMINOPHEN 5-325 MG PO TABS: 1.0000 | ORAL_TABLET | Freq: Four times a day (QID) | ORAL | 0 refills | Status: AC | PRN

## 2022-09-05 MED ORDER — CEPHALEXIN 500 MG PO CAPS: 500.0000 mg | ORAL_CAPSULE | Freq: Four times a day (QID) | ORAL | 0 refills | Status: AC

## 2022-09-05 NOTE — ED Notes (Signed)
Went over dc papers. All questions answered. 

## 2022-09-06 MED FILL — Oxycodone w/ Acetaminophen Tab 5-325 MG: ORAL | Qty: 6 | Status: AC

## 2022-09-07 LAB — URINE CULTURE: Culture: >=100000 — AB

## 2022-09-08 ENCOUNTER — Telehealth (HOSPITAL_BASED_OUTPATIENT_CLINIC_OR_DEPARTMENT_OTHER): Payer: Self-pay | Admitting: *Deleted

## 2022-09-08 NOTE — Telephone Encounter (Signed)
Post ED Visit - Positive Culture Follow-up  Culture report reviewed by antimicrobial stewardship pharmacist: [facility] []  , Pharm.D. []  Enzo Bi, .D., BCPS AQ-ID []  Celedonio Miyamoto, Pharm.D., BCPS []  [address], .D., BCPS []  [place], .D., BCPS, AAHIVP []  Georgina Pillion, Pharm.D., BCPS, AAHIVP []  [address], PharmD, BCPS []  , PharmD, BCPS []  [place], PharmD, BCPS []  [place], PharmD []  , PharmD, BCPS [x]  Estella Husk, PharmD  Pharmacy Team []  Lysle Pearl, PharmD []  , PharmD []  Phillips Climes, PharmD []  , Rph []  Agapito Games) , PharmD []  Verlan Friends, PharmD []  , PharmD []  Mervyn Gay, PharmD []  , PharmD []  Riccardo Dubin, PharmD []  [facility], PharmD []  , PharmD []  Len Childs, PharmD   Positive urine culture Treated with Cephalexin, organism sensitive to the same and no further patient follow-up is required at this time.  09/08/2022, 12:43 PM

## 2024-06-25 ENCOUNTER — Ambulatory Visit
Admission: EM | Admit: 2024-06-25 | Discharge: 2024-06-25 | Disposition: A | Discharge status: Home / Self Care | Attending: Family Medicine | Admitting: Family Medicine

## 2024-06-25 DIAGNOSIS — N390 Urinary tract infection, site not specified: Secondary | ICD-10-CM

## 2024-06-25 HISTORY — DX: Reaction to severe stress, unspecified: F43.9

## 2024-06-25 LAB — POCT URINE DIPSTICK
Bilirubin, UA: NEGATIVE
Glucose, UA: 100 mg/dL — AB
Nitrite, UA: POSITIVE — AB
POC PROTEIN,UA: >=300 — AB
Spec Grav, UA: 1.01 (ref 1.010–1.025)
Urobilinogen, UA: 2 U/dL — AB
pH, UA: 5 (ref 5.0–8.0)

## 2024-06-25 MED ORDER — CIPROFLOXACIN HCL 500 MG PO TABS: 500.0000 mg | ORAL_TABLET | Freq: Two times a day (BID) | ORAL | 0 refills | Status: DC

## 2024-06-25 NOTE — ED Triage Notes (Signed)
 Pt reports he has hematuria, chills, vomiting, bladder pressure, and headaches x 3 days    Took azo x 1 day

## 2024-06-25 NOTE — ED Provider Notes (Signed)
 [facility]    CSN: [id number] Arrival date & time: 06/25/24  1318      History   Chief Complaint No chief complaint on file.   HPI Nicholas Sims is a 68 y.o. male.   Patient presenting today with 3-day history of hematuria, chills, suprapubic pressure, headaches, nausea and vomiting.  Denies chest pain, shortness of breath, diarrhea, constipation, fever, flank pain.  So far trying Azo x 1 day with minimal relief.  States he gets reoccurring urinary tract infections that feel the same.    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 No pertinent surgical history.     Home Medications    Prior to Admission medications   Medication Sig Start Date End Date Taking? Authorizing Provider  ciprofloxacin  (CIPRO ) 500 MG tablet Take 1 tablet (500 mg total) by mouth 2 (two) times daily. 06/25/24  Yes Stuart Vernell Norris, PA-C  febuxostat (ULORIC) 40 MG tablet Take 40 mg by mouth daily.   Yes [provider]  sildenafil (VIAGRA) 100 MG tablet TAKE ONE TABLET BY MOUTH AS INSTRUCTED (TAKE 1 HOUR PRIOR TO SEXUAL ACTIVITY *DO NOT EXCEED 1 DOSE PER 24 HOUR PERIOD*)  SEPARATE FROM TAMSULOSIN BY 12 HOURS (TAKE 1 HOUR PRIOR TO SEXUAL ACTIVITY *DO NOT EXCEED 1 DOSE PER 24 HOUR PERIOD*)  SEPARATE FROM TAMSULOSIN BY 12 HOURS 08/15/22  Yes [provider]  acetaminophen  (TYLENOL ) 500 MG tablet Take 500 mg by mouth every 6 (six) hours as needed.    [provider]  albuterol (VENTOLIN HFA) 108 (90 Base) MCG/ACT inhaler Inhale into the lungs. 03/20/21   [provider]  cetirizine  (ZYRTEC ) 10 MG tablet Take 1 tablet (10 mg total) by mouth daily. 05/04/21    Cari Arlean HERO, FNP  fluticasone  (FLONASE ) 50 MCG/ACT nasal spray Place 2 sprays into both nostrils daily. 05/04/21   Ambs, Arlean HERO, FNP  fluticasone -salmeterol (WIXELA INHUB) 250-50 MCG/ACT AEPB Inhale 1 puff into the lungs in the morning and at bedtime. 05/04/21   Cari Arlean HERO, FNP  furosemide (LASIX) 20 MG tablet Take 20 mg by mouth as needed.    [provider]  losartan (COZAAR) 50 MG tablet Take 50 mg by mouth daily.    [provider]  oxyCODONE -acetaminophen  (PERCOCET/ROXICET) 5-325 MG tablet Take 1 tablet by mouth every 6 (six) hours as needed for severe pain. 09/05/22   Mesner, Selinda, MD  phenazopyridine  (PYRIDIUM ) 100 MG tablet Take 1 tablet (100 mg total) by mouth 3 (three) times daily as needed for pain. Patient not taking: Reported on 05/04/2021 10/10/19   Avegno, Komlanvi S, FNP  potassium chloride (MICRO-K) 10 MEQ CR capsule Take 10 mEq by mouth 2 (two) times daily.    [provider]  rosuvastatin (CRESTOR) 10 MG tablet Take 10 mg by mouth daily.    [provider]  tamsulosin (FLOMAX) 0.4 MG CAPS capsule Take 0.4 mg by mouth.    [provider]    Family History Family History  Problem Relation Age of Onset   Diabetes Mother    COPD Father     Social History Social History   Tobacco Use  Smoking status: Former   Smokeless tobacco: Never  Vaping Use   Vaping status: Never Used  Substance Use Topics   Alcohol use: Not Currently   Drug use: Not Currently     Allergies   Penicillins and Statins   Review of Systems Review of Systems Per HPI  Physical Exam Triage Vital Signs ED Triage Vitals  Encounter Vitals Group     BP 06/25/24 1342 110/69     Girls Systolic BP Percentile --      Girls Diastolic BP Percentile --      Boys Systolic BP Percentile --      Boys Diastolic BP Percentile --      Pulse Rate 06/25/24 1342 82     Resp 06/25/24 1342 20     Temp 06/25/24 1342 98.7 F (37.1 C)     Temp Source 06/25/24  1342 Oral     SpO2 06/25/24 1342 92 %     Weight --      Height --      Head Circumference --      Peak Flow --      Pain Score 06/25/24 1344 0     Pain Loc --      Pain Education --      Exclude from Growth Chart --    No data found.  Updated Vital Signs BP 110/69 (BP Location: Right Arm)   Pulse 82   Temp 98.7 F (37.1 C) (Oral)   Resp 20   SpO2 92%   Visual Acuity Right Eye Distance:   Left Eye Distance:   Bilateral Distance:    Right Eye Near:   Left Eye Near:    Bilateral Near:     Physical Exam Vitals and nursing note reviewed.  Constitutional:      Appearance: Normal appearance.  HENT:     Head: Atraumatic.  Eyes:     Extraocular Movements: Extraocular movements intact.     Conjunctiva/sclera: Conjunctivae normal.  Cardiovascular:     Rate and Rhythm: Normal rate.  Pulmonary:     Effort: Pulmonary effort is normal.  Abdominal:     General: Bowel sounds are normal. There is no distension.     Palpations: Abdomen is soft.     Tenderness: There is no abdominal tenderness. There is no right CVA tenderness, left CVA tenderness or guarding.  Musculoskeletal:        General: Normal range of motion.     Cervical back: Normal range of motion and neck supple.  Skin:    General: Skin is warm and dry.  Neurological:     General: No focal deficit present.     Mental Status: He is oriented to person, place, and time.  Psychiatric:        Mood and Affect: Mood normal.        Thought Content: Thought content normal.        Judgment: Judgment normal.      UC Treatments / Results  Labs (all labs ordered are listed, but only abnormal results are displayed) Labs Reviewed  POCT URINE DIPSTICK - Abnormal; Notable for the following components:      Result Value   Color, UA orange (*)    Glucose, UA =100 (*)    Ketones, POC UA trace (5) (*)    Blood, UA large (*)    POC PROTEIN,UA >=300 (*)    Urobilinogen, UA 2.0 (*)    Nitrite, UA Positive (*)  Leukocytes, UA Large (3+) (*)    All other components within normal limits  URINE CULTURE    EKG   Radiology No results found.  Procedures Procedures (including critical care time)  Medications Ordered in UC Medications - No data to display  Initial Impression / Assessment and Plan / UC Course  I have reviewed the triage vital signs and the nursing notes.  Pertinent labs & imaging results that were available during my care of the patient were reviewed by me and considered in my medical decision making (see chart for details).     Urinalysis does show evidence of urinary tract infection today though is potentially a bit skewed due to the Azo use.  Urine culture pending for further evaluation, and in the meantime we will treat empirically with Cipro , fluids, Azo as needed.  Adjust if needed based on culture results.  Return for worsening symptoms.  Final Clinical Impressions(s) / UC Diagnoses   Final diagnoses:  Acute lower UTI   Discharge Instructions   None    ED Prescriptions     Medication Sig Dispense Auth. Provider   ciprofloxacin  (CIPRO ) 500 MG tablet Take 1 tablet (500 mg total) by mouth 2 (two) times daily. 14 tablet Stuart Vernell Norris, [place]      PDMP not reviewed this encounter.   Stuart Vernell Norris, [place] 06/25/24 1526

## 2024-06-29 ENCOUNTER — Ambulatory Visit (HOSPITAL_COMMUNITY): Payer: Self-pay

## 2024-06-29 LAB — URINE CULTURE: Culture: >=100000 — AB

## 2024-06-29 MED ORDER — SULFAMETHOXAZOLE-TRIMETHOPRIM 800-160 MG PO TABS: 1.0000 | ORAL_TABLET | Freq: Two times a day (BID) | ORAL | 0 refills | Status: AC

## 2024-08-03 ENCOUNTER — Other Ambulatory Visit: Payer: Self-pay

## 2024-08-03 ENCOUNTER — Encounter: Payer: Self-pay | Admitting: Emergency Medicine

## 2024-08-03 ENCOUNTER — Ambulatory Visit
Admission: EM | Admit: 2024-08-03 | Discharge: 2024-08-03 | Disposition: A | Discharge status: Home / Self Care | Attending: Family Medicine | Admitting: Family Medicine

## 2024-08-03 DIAGNOSIS — N390 Urinary tract infection, site not specified: Secondary | ICD-10-CM | POA: Diagnosis not present

## 2024-08-03 DIAGNOSIS — R339 Retention of urine, unspecified: Secondary | ICD-10-CM | POA: Diagnosis present

## 2024-08-03 LAB — POCT URINE DIPSTICK
Bilirubin, UA: NEGATIVE
Glucose, UA: NEGATIVE mg/dL
Ketones, POC UA: NEGATIVE mg/dL
Nitrite, UA: POSITIVE — AB
POC PROTEIN,UA: >=300 — AB
Spec Grav, UA: 1.015 (ref 1.010–1.025)
Urobilinogen, UA: 0.2 U/dL
pH, UA: 6.5 (ref 5.0–8.0)

## 2024-08-03 MED ORDER — SULFAMETHOXAZOLE-TRIMETHOPRIM 800-160 MG PO TABS: 1.0000 | ORAL_TABLET | Freq: Two times a day (BID) | ORAL | 0 refills | Status: AC

## 2024-08-03 NOTE — ED Triage Notes (Signed)
 Pt reports decreased urinary output, cloudy urine, chills x2 weeks. Pt reports was treated for similar in September.

## 2024-08-06 ENCOUNTER — Ambulatory Visit (HOSPITAL_COMMUNITY): Payer: Self-pay

## 2024-08-06 LAB — URINE CULTURE: Culture: >=100000 — AB

## 2024-08-06 NOTE — ED Provider Notes (Signed)
 [facility]    CSN: [id number] Arrival date & time: 08/03/24  1211      History   Chief Complaint Chief Complaint  Patient presents with   Urinary Retention    HPI Nicholas Sims is a 68 y.o. male.   Patient presenting today with 2-week history of cloudy urine, chills, decreased urinary output.  Denies fever, abdominal pain, nausea, vomiting, flank pain.  Recently treated for UTI last month but states he never felt like he got fully better.  Denies concern for STIs.    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 No pertinent surgical history.     Home Medications    Prior to Admission medications   Medication Sig Start Date End Date Taking? Authorizing Provider  Aspirin (VAZALORE) 81 MG CAPS Take 81 mg by mouth. 07/17/22  Yes [provider]  Cholecalciferol 417 MCG/ML LIQD Take by mouth. 05/21/22  Yes [provider]  cilostazol (PLETAL) 100 MG tablet Take 100 mg by mouth. 04/29/22  Yes [provider]  spironolactone (ALDACTONE) 25 MG tablet Take 12.5 mg by mouth daily. 11/12/22  Yes [provider]  sulfamethoxazole -trimethoprim  (BACTRIM  DS) 800-160 MG tablet Take 1 tablet by mouth 2 (two) times daily for 7 days. 08/03/24 08/10/24 Yes Stuart Vernell Norris, PA-C  acetaminophen  (TYLENOL ) 500 MG tablet Take 500 mg by mouth every 6 (six) hours as needed.    [provider]  albuterol (VENTOLIN HFA) 108 (90 Base) MCG/ACT inhaler Inhale into the lungs. 03/20/21   [provider]  cetirizine  (ZYRTEC ) 10 MG tablet Take 1 tablet (10 mg total) by mouth daily. 05/04/21   Cari Arlean HERO, FNP  ciprofloxacin  (CIPRO ) 500 MG  tablet Take 1 tablet (500 mg total) by mouth 2 (two) times daily. 06/25/24   Stuart Vernell Norris, PA-C  febuxostat (ULORIC) 40 MG tablet Take 40 mg by mouth daily.    [provider]  fluticasone  (FLONASE ) 50 MCG/ACT nasal spray Place 2 sprays into both nostrils daily. 05/04/21   Ambs, Arlean HERO, FNP  fluticasone -salmeterol (WIXELA INHUB) 250-50 MCG/ACT AEPB Inhale 1 puff into the lungs in the morning and at bedtime. 05/04/21   Cari Arlean HERO, FNP  furosemide (LASIX) 20 MG tablet Take 20 mg by mouth as needed.    [provider]  losartan (COZAAR) 50 MG tablet Take 50 mg by mouth daily.    [provider]  oxyCODONE -acetaminophen  (PERCOCET/ROXICET) 5-325 MG tablet Take 1 tablet by mouth every 6 (six) hours as needed for severe pain. 09/05/22   Mesner, Selinda, MD  phenazopyridine  (PYRIDIUM ) 100 MG tablet Take 1 tablet (100 mg total) by mouth 3 (three) times daily as needed for pain. Patient not taking: Reported on 05/04/2021 10/10/19   Avegno, Komlanvi S, FNP  potassium chloride (MICRO-K) 10 MEQ CR capsule Take 10 mEq by mouth 2 (two) times daily.    [provider]  rosuvastatin (CRESTOR) 10 MG tablet Take 10 mg by mouth daily.    [provider]  sertraline (ZOLOFT) 100 MG tablet Take 100 mg by mouth.    [provider]  sildenafil (VIAGRA) 100 MG tablet TAKE ONE  TABLET BY MOUTH AS INSTRUCTED (TAKE 1 HOUR PRIOR TO SEXUAL ACTIVITY *DO NOT EXCEED 1 DOSE PER 24 HOUR PERIOD*)  SEPARATE FROM TAMSULOSIN BY 12 HOURS (TAKE 1 HOUR PRIOR TO SEXUAL ACTIVITY *DO NOT EXCEED 1 DOSE PER 24 HOUR PERIOD*)  SEPARATE FROM TAMSULOSIN BY 12 HOURS 08/15/22   [provider]  tamsulosin (FLOMAX) 0.4 MG CAPS capsule Take 0.4 mg by mouth.    [provider]    Family History Family History  Problem Relation Age of Onset   Diabetes Mother    COPD Father     Social History Social History   Tobacco Use   Smoking status: Former   Smokeless tobacco:  Never  Vaping Use   Vaping status: Never Used  Substance Use Topics   Alcohol use: Not Currently   Drug use: Not Currently     Allergies   Penicillins and Statins   Review of Systems Review of Systems Per HPI  Physical Exam Triage Vital Signs ED Triage Vitals  Encounter Vitals Group     BP 08/03/24 1216 (!) 158/81     Girls Systolic BP Percentile --      Girls Diastolic BP Percentile --      Boys Systolic BP Percentile --      Boys Diastolic BP Percentile --      Pulse Rate 08/03/24 1216 99     Resp 08/03/24 1216 20     Temp 08/03/24 1216 98.5 F (36.9 C)     Temp Source 08/03/24 1216 Oral     SpO2 08/03/24 1216 94 %     Weight --      Height --      Head Circumference --      Peak Flow --      Pain Score 08/03/24 1218 7     Pain Loc --      Pain Education --      Exclude from Growth Chart --    No data found.  Updated Vital Signs BP (!) 158/81 (BP Location: Right Arm)   Pulse 99   Temp 98.5 F (36.9 C) (Oral)   Resp 20   SpO2 94%   Visual Acuity Right Eye Distance:   Left Eye Distance:   Bilateral Distance:    Right Eye Near:   Left Eye Near:    Bilateral Near:     Physical Exam Vitals and nursing note reviewed.  Constitutional:      Appearance: Normal appearance.  HENT:     Head: Atraumatic.  Eyes:     Extraocular Movements: Extraocular movements intact.     Conjunctiva/sclera: Conjunctivae normal.  Cardiovascular:     Rate and Rhythm: Normal rate.  Pulmonary:     Effort: Pulmonary effort is normal.  Abdominal:     General: Bowel sounds are normal. There is no distension.     Palpations: Abdomen is soft.     Tenderness: There is no abdominal tenderness. There is no right CVA tenderness, left CVA tenderness or guarding.  Musculoskeletal:        General: Normal range of motion.     Cervical back: Normal range of motion and neck supple.  Skin:    General: Skin is warm and dry.  Neurological:     Mental Status: Mental status is at  baseline.  Psychiatric:        Mood and Affect: Mood normal.        Thought Content: Thought content normal.  Judgment: Judgment normal.      UC Treatments / Results  Labs (all labs ordered are listed, but only abnormal results are displayed) Labs Reviewed  URINE CULTURE - Abnormal; Notable for the following components:      Result Value   Culture   (*)    Value: >=100,000 COLONIES/mL ESCHERICHIA COLI IDENTIFICATION AND SUSCEPTIBILITIES TO FOLLOW Performed at [facility], [address] [place], [place] [address]    All other components within normal limits  POCT URINE DIPSTICK - Abnormal; Notable for the following components:   Clarity, UA cloudy (*)    Blood, UA large (*)    POC PROTEIN,UA >=300 (*)    Nitrite, UA Positive (*)    Leukocytes, UA Large (3+) (*)    All other components within normal limits    EKG   Radiology No results found.  Procedures Procedures (including critical care time)  Medications Ordered in UC Medications - No data to display  Initial Impression / Assessment and Plan / UC Course  I have reviewed the triage vital signs and the nursing notes.  Pertinent labs & imaging results that were available during my care of the patient were reviewed by me and considered in my medical decision making (see chart for details).     Vital signs and exam overall reassuring today, does show evidence of urinary tract infection on urinalysis today so we will treat with Bactrim  while awaiting urine culture and adjust if needed.  Follow-up with PCP or urology for recheck  Final Clinical Impressions(s) / UC Diagnoses   Final diagnoses:  Acute lower UTI   Discharge Instructions   None    ED Prescriptions     Medication Sig Dispense Auth. Provider   sulfamethoxazole -trimethoprim  (BACTRIM  DS) 800-160 MG tablet Take 1 tablet by mouth 2 (two) times daily for 7 days. 14 tablet Stuart Vernell Norris, [place]      PDMP not reviewed this  encounter.   Stuart Vernell [place], [place] 08/06/24 [phone]

## 2024-10-28 ENCOUNTER — Ambulatory Visit
Admission: EM | Admit: 2024-10-28 | Discharge: 2024-10-28 | Disposition: A | Discharge status: Home / Self Care | Attending: Nurse Practitioner | Admitting: Nurse Practitioner

## 2024-10-28 ENCOUNTER — Encounter: Payer: Self-pay | Admitting: Emergency Medicine

## 2024-10-28 DIAGNOSIS — R52 Pain, unspecified: Secondary | ICD-10-CM | POA: Insufficient documentation

## 2024-10-28 DIAGNOSIS — N390 Urinary tract infection, site not specified: Secondary | ICD-10-CM | POA: Diagnosis present

## 2024-10-28 LAB — POCT URINE DIPSTICK
Bilirubin, UA: NEGATIVE
Glucose, UA: NEGATIVE mg/dL
Ketones, POC UA: NEGATIVE mg/dL
Nitrite, UA: POSITIVE — AB
POC PROTEIN,UA: >=300 — AB
Spec Grav, UA: 1.02
Urobilinogen, UA: 1 U/dL
pH, UA: 5.5

## 2024-10-28 LAB — POCT INFLUENZA A/B
Influenza A, POC: NEGATIVE
Influenza B, POC: NEGATIVE

## 2024-10-28 MED ORDER — SULFAMETHOXAZOLE-TRIMETHOPRIM 800-160 MG PO TABS: 1.0000 | ORAL_TABLET | Freq: Two times a day (BID) | ORAL | 0 refills | Status: AC

## 2024-10-28 NOTE — Discharge Instructions (Addendum)
 Influenza test is negative today.  You have another urinary tract infection.  Take the Bactrim  as prescribed to treat it.  We will contact you if the urine culture shows we need to change the antibiotic.  Plan to follow up with Urology as planned.  Make sure you are drinking plenty of fluid.  If symptoms worsen despite treatment, please seek care in the ER.

## 2024-10-28 NOTE — ED Triage Notes (Signed)
 Headache, body aches, sweating x 2 days.  States when he gets these symptoms, he has a UTI.  Wants urine tested.

## 2024-10-28 NOTE — ED Provider Notes (Signed)
 " [facility]    CSN: [id number] Arrival date & time: 10/28/24  1327      History   Chief Complaint No chief complaint on file.   HPI VASH QUEZADA is a 69 y.o. male.   Patient presents today with 2 day history of urinary frequency, urinary urgency, body aches, sweating, headache, lower abdominal pain, and decreased appetite.  He denies dysuria, hematuria, flank pain, fever, nausea/vomiting, and penile discharge.  No concern for STI today.  Denies known influenza exposure.  Reports history of UTI; last was 07/2024.  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 No pertinent surgical history.     Home Medications    Prior to Admission medications  Medication Sig Start Date End Date Taking? Authorizing Provider  sulfamethoxazole -trimethoprim  (BACTRIM  DS) 800-160 MG tablet Take 1 tablet by mouth 2 (two) times daily for 7 days. 10/28/24 11/04/24 Yes Chandra Harlene LABOR, NP  acetaminophen  (TYLENOL ) 500 MG tablet Take 500 mg by mouth every 6 (six) hours as needed.    [provider]  albuterol (VENTOLIN HFA) 108 (90 Base) MCG/ACT inhaler Inhale into the lungs. 03/20/21   [provider]  Aspirin (VAZALORE) 81 MG CAPS Take 81 mg by mouth. 07/17/22   [provider]  cetirizine  (ZYRTEC ) 10 MG tablet Take 1 tablet (10 mg total) by mouth daily. 05/04/21   Cari Arlean HERO, FNP  Cholecalciferol 417 MCG/ML LIQD Take by mouth. 05/21/22   [provider]  cilostazol (PLETAL) 100 MG tablet Take 100 mg by mouth. 04/29/22   [provider]  febuxostat  (ULORIC) 40 MG tablet Take 40 mg by mouth daily.    [provider]  fluticasone  (FLONASE ) 50 MCG/ACT nasal spray Place 2 sprays into both nostrils daily. 05/04/21   Ambs, Arlean HERO, FNP  fluticasone -salmeterol (WIXELA INHUB) 250-50 MCG/ACT AEPB Inhale 1 puff into the lungs in the morning and at bedtime. 05/04/21   Cari Arlean HERO, FNP  furosemide (LASIX) 20 MG tablet Take 20 mg by mouth as needed.    [provider]  losartan (COZAAR) 50 MG tablet Take 50 mg by mouth daily.    [provider]  oxyCODONE -acetaminophen  (PERCOCET/ROXICET) 5-325 MG tablet Take 1 tablet by mouth every 6 (six) hours as needed for severe pain. 09/05/22   Mesner, Selinda, MD  phenazopyridine  (PYRIDIUM ) 100 MG tablet Take 1 tablet (100 mg total) by mouth 3 (three) times daily as needed for pain. Patient not taking: Reported on 05/04/2021 10/10/19   Avegno, Komlanvi S, FNP  potassium chloride (MICRO-K) 10 MEQ CR capsule Take 10 mEq by mouth 2 (two) times daily.    [provider]  rosuvastatin (CRESTOR) 10 MG tablet Take 10 mg by mouth daily.    [provider]  sertraline (ZOLOFT) 100 MG tablet Take 100 mg by mouth.    [provider]  sildenafil (VIAGRA) 100 MG tablet TAKE ONE TABLET BY MOUTH AS INSTRUCTED (TAKE 1 HOUR PRIOR TO SEXUAL ACTIVITY *DO NOT EXCEED 1 DOSE PER 24  HOUR PERIOD*)  SEPARATE FROM TAMSULOSIN BY 12 HOURS (TAKE 1 HOUR PRIOR TO SEXUAL ACTIVITY *DO NOT EXCEED 1 DOSE PER 24 HOUR PERIOD*)  SEPARATE FROM TAMSULOSIN BY 12 HOURS 08/15/22   [provider]  spironolactone (ALDACTONE) 25 MG tablet Take 12.5 mg by mouth daily. 11/12/22   [provider]  tamsulosin (FLOMAX) 0.4 MG CAPS capsule Take 0.4 mg by mouth.    [provider]    Family History Family History  Problem Relation Age of Onset   Diabetes Mother    COPD Father     Social History Social History[1]   Allergies   Penicillins and Statins   Review of Systems Review of  Systems Per HPI  Physical Exam Triage Vital Signs ED Triage Vitals  Encounter Vitals Group     BP 10/28/24 1347 133/75     Girls Systolic BP Percentile --      Girls Diastolic BP Percentile --      Boys Systolic BP Percentile --      Boys Diastolic BP Percentile --      Pulse Rate 10/28/24 1347 93     Resp 10/28/24 1347 18     Temp 10/28/24 1347 99.3 F (37.4 C)     Temp Source 10/28/24 1347 Oral     SpO2 10/28/24 1347 94 %     Weight --      Height --      Head Circumference --      Peak Flow --      Pain Score 10/28/24 1348 8     Pain Loc --      Pain Education --      Exclude from Growth Chart --    No data found.  Updated Vital Signs BP 133/75 (BP Location: Right Arm)   Pulse 93   Temp 99.3 F (37.4 C) (Oral)   Resp 18   SpO2 94%   Visual Acuity Right Eye Distance:   Left Eye Distance:   Bilateral Distance:    Right Eye Near:   Left Eye Near:    Bilateral Near:     Physical Exam Vitals and nursing note reviewed.  Constitutional:      General: He is not in acute distress.    Appearance: Normal appearance. He is not toxic-appearing.  HENT:     Mouth/Throat:     Mouth: Mucous membranes are moist.     Pharynx: Oropharynx is clear.  Cardiovascular:     Rate and Rhythm: Normal rate and regular rhythm.  Pulmonary:     Effort: Pulmonary effort is normal. No respiratory distress.     Breath sounds: Normal breath sounds. No wheezing, rhonchi or rales.  Abdominal:     General: Abdomen is flat. Bowel sounds are normal. There is no distension.     Palpations: Abdomen is soft.     Tenderness: There is no abdominal tenderness. There is no right CVA tenderness, left CVA tenderness or guarding.  Skin:    General: Skin is warm and dry.     Coloration: Skin is not jaundiced or pale.     Findings: No erythema.  Neurological:     Mental Status: He is alert and oriented to person, place, and time.  Psychiatric:        Behavior: Behavior is cooperative.       UC Treatments / Results  Labs (all labs ordered are listed, but only abnormal results are displayed) Labs Reviewed  POCT URINE DIPSTICK -  Abnormal; Notable for the following components:      Result Value   Clarity, UA cloudy (*)    Blood, UA large (*)    POC PROTEIN,UA >=300 (*)    Nitrite, UA Positive (*)    Leukocytes, UA Small (1+) (*)    All other components within normal limits  POCT INFLUENZA A/B - Normal  URINE CULTURE    EKG   Radiology No results found.  Procedures Procedures (including critical care time)  Medications Ordered in UC Medications - No data to display  Initial Impression / Assessment and Plan / UC Course  I have reviewed the triage vital signs and the nursing notes.  Pertinent labs & imaging results that were available during my care of the patient were reviewed by me and considered in my medical decision making (see chart for details).   Patient is a pleasant, well-appearing 69 year old male presenting today for urinary frequency, urgency, body aches, diaphoresis, headache, and lower abdominal pain.  Vital signs are stable in triage.  Reports the symptoms are consistent with UTI.  Influenza testing obtained given current levels of influenza locally and negative today.  Urinalysis is positive for UTI.  Microbiology reviewed; there are multiple resistances and intermediate susceptibilities, so will treat with Bactrim  again.  No need for renal dosing based on most recent creatinine of 1.61; creatinine clearance greater than 30.  Recommended close follow-up with urology as planned.  Strict ER precautions discussed in the meantime.  The patient was given the opportunity to ask questions.  All questions answered to their satisfaction.  The patient is in agreement to this plan.   Final Clinical Impressions(s) / UC Diagnoses   Final diagnoses:  Complicated UTI (urinary tract infection)  Body aches     Discharge Instructions      Influenza test  is negative today.  You have another urinary tract infection.  Take the Bactrim  as prescribed to treat it.  We will contact you if the urine culture shows we need to change the antibiotic.  Plan to follow up with Urology as planned.  Make sure you are drinking plenty of fluid.  If symptoms worsen despite treatment, please seek care in the ER.       ED Prescriptions     Medication Sig Dispense Auth. Provider   sulfamethoxazole -trimethoprim  (BACTRIM  DS) 800-160 MG tablet Take 1 tablet by mouth 2 (two) times daily for 7 days. 14 tablet Chandra Harlene LABOR, NP      PDMP not reviewed this encounter.     [1]  Social History Tobacco Use   Smoking status: Former   Smokeless tobacco: Never  Vaping Use   Vaping status: Never Used  Substance Use Topics   Alcohol use: Not Currently   Drug use: Not Currently     Chandra Harlene LABOR, NP 10/28/24 1623  "

## 2024-10-30 LAB — URINE CULTURE: Culture: >=100000 — AB

## 2024-11-01 ENCOUNTER — Ambulatory Visit (HOSPITAL_COMMUNITY): Payer: Self-pay
